# Patient Record
Sex: Female | Born: 1995 | Race: White | Hispanic: No | Marital: Single | Smoking: Never smoker
Health system: Southern US, Community
[De-identification: ages and names within clinical notes are randomized; demographics above are authoritative.]

## PROBLEM LIST (undated history)

## (undated) DIAGNOSIS — F329 Major depressive disorder, single episode, unspecified: Secondary | ICD-10-CM

## (undated) DIAGNOSIS — R112 Nausea with vomiting, unspecified: Secondary | ICD-10-CM

## (undated) DIAGNOSIS — N83209 Unspecified ovarian cyst, unspecified side: Secondary | ICD-10-CM

## (undated) DIAGNOSIS — T4145XA Adverse effect of unspecified anesthetic, initial encounter: Secondary | ICD-10-CM

## (undated) DIAGNOSIS — F419 Anxiety disorder, unspecified: Secondary | ICD-10-CM

## (undated) DIAGNOSIS — F32A Depression, unspecified: Secondary | ICD-10-CM

## (undated) DIAGNOSIS — Z9889 Other specified postprocedural states: Secondary | ICD-10-CM

## (undated) DIAGNOSIS — R519 Headache, unspecified: Secondary | ICD-10-CM

## (undated) DIAGNOSIS — R87629 Unspecified abnormal cytological findings in specimens from vagina: Secondary | ICD-10-CM

## (undated) DIAGNOSIS — S82899A Other fracture of unspecified lower leg, initial encounter for closed fracture: Secondary | ICD-10-CM

## (undated) DIAGNOSIS — F909 Attention-deficit hyperactivity disorder, unspecified type: Secondary | ICD-10-CM

## (undated) DIAGNOSIS — E039 Hypothyroidism, unspecified: Secondary | ICD-10-CM

## (undated) DIAGNOSIS — T8859XA Other complications of anesthesia, initial encounter: Secondary | ICD-10-CM

## (undated) DIAGNOSIS — E079 Disorder of thyroid, unspecified: Secondary | ICD-10-CM

## (undated) DIAGNOSIS — G932 Benign intracranial hypertension: Secondary | ICD-10-CM

## (undated) DIAGNOSIS — N390 Urinary tract infection, site not specified: Secondary | ICD-10-CM

## (undated) HISTORY — DX: Anxiety disorder, unspecified: F41.9

## (undated) HISTORY — PX: DILATION AND CURETTAGE OF UTERUS: SHX78

## (undated) HISTORY — PX: TYMPANOSTOMY TUBE PLACEMENT: SHX32

## (undated) HISTORY — PX: CHOLECYSTECTOMY: SHX55

## (undated) HISTORY — DX: Depression, unspecified: F32.A

## (undated) HISTORY — DX: Attention-deficit hyperactivity disorder, unspecified type: F90.9

## (undated) HISTORY — PX: ADENOIDECTOMY: SUR15

## (undated) HISTORY — DX: Adverse effect of unspecified anesthetic, initial encounter: T41.45XA

## (undated) HISTORY — DX: Disorder of thyroid, unspecified: E07.9

---
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
## 2014-04-14 HISTORY — PX: LAPAROSCOPIC CHOLECYSTECTOMY: SUR755

## 2015-06-26 ENCOUNTER — Other Ambulatory Visit: Payer: Self-pay | Admitting: Obstetrics and Gynecology

## 2015-06-26 NOTE — H&P (Signed)
20 y.o. yo G1 with a 6 week size MAB at 9 weeks by dates.  She has had two [place] that show a fetal pole without FHTs.  She has had only scant bleeding.  No past medical history on file.No past surgical history on file.  Social History   Social History  . Marital Status: Married    Spouse Name: N/A  . Number of Children: N/A  . Years of Education: N/A   Occupational History  . Not on file.   Social History Main Topics  . Smoking status: Not on file  . Smokeless tobacco: Not on file  . Alcohol Use: Not on file  . Drug Use: Not on file  . Sexual Activity: Not on file   Other Topics Concern  . Not on file   Social History Narrative  . No narrative on file    No current facility-administered medications on file prior to encounter.   No current outpatient prescriptions on file prior to encounter.    Allergies  Allergen Reactions  . Xanax [Alprazolam] Anxiety    Has opposite effect    @VITALS2 @  Lungs: clear to ascultation Cor:  RRR Abdomen:  soft, nontender, nondistended. Ex:  no cords, erythema Pelvic:  Deferred to OR.  A:  MAB at 6 weeks, desires definitive therapy.  Pt's blod type is A+.   P: All risks, benefits and alternatives d/w patient and she desires to proceed.  Alaynah Schutter A

## 2015-06-27 ENCOUNTER — Ambulatory Visit (HOSPITAL_COMMUNITY): Payer: BLUE CROSS/BLUE SHIELD | Admitting: Anesthesiology

## 2015-06-27 ENCOUNTER — Encounter (HOSPITAL_COMMUNITY)
Admission: RE | Disposition: A | Discharge status: Home / Self Care | Payer: Self-pay | Source: Ambulatory Visit | Attending: Obstetrics and Gynecology

## 2015-06-27 ENCOUNTER — Encounter (HOSPITAL_COMMUNITY): Payer: Self-pay | Admitting: Anesthesiology

## 2015-06-27 ENCOUNTER — Ambulatory Visit (HOSPITAL_COMMUNITY)
Admission: RE | Admit: 2015-06-27 | Discharge: 2015-06-27 | Disposition: A | Discharge status: Home / Self Care | Payer: BLUE CROSS/BLUE SHIELD | Source: Ambulatory Visit | Attending: Obstetrics and Gynecology | Admitting: Obstetrics and Gynecology

## 2015-06-27 DIAGNOSIS — Z3A09 9 weeks gestation of pregnancy: Secondary | ICD-10-CM | POA: Insufficient documentation

## 2015-06-27 DIAGNOSIS — O021 Missed abortion: Secondary | ICD-10-CM | POA: Diagnosis not present

## 2015-06-27 HISTORY — DX: Benign intracranial hypertension: G93.2

## 2015-06-27 HISTORY — PX: DILATION AND EVACUATION: SHX1459

## 2015-06-27 LAB — CBC
HEMATOCRIT: 37.3 % (ref 36.0–46.0)
HEMOGLOBIN: 12.5 g/dL (ref 12.0–15.0)
MCH: 28.5 pg (ref 26.0–34.0)
MCHC: 33.5 g/dL (ref 30.0–36.0)
MCV: 85.2 fL (ref 78.0–100.0)
Platelets: 283 10*3/uL (ref 150–400)
RBC: 4.38 MIL/uL (ref 3.87–5.11)
RDW: 13.1 % (ref 11.5–15.5)
WBC: 8.5 10*3/uL (ref 4.0–10.5)

## 2015-06-27 SURGERY — DILATION AND EVACUATION, UTERUS
Anesthesia: General | Site: Vagina

## 2015-06-27 MED ORDER — PROPOFOL 10 MG/ML IV BOLUS
INTRAVENOUS | Status: AC
  Filled 2015-06-27: qty 20

## 2015-06-27 MED ORDER — ONDANSETRON HCL 4 MG/2ML IJ SOLN
INTRAMUSCULAR | Status: DC | PRN
  Administered 2015-06-27: 4 mg via INTRAVENOUS

## 2015-06-27 MED ORDER — LIDOCAINE HCL (CARDIAC) 20 MG/ML IV SOLN
INTRAVENOUS | Status: AC
  Filled 2015-06-27: qty 5

## 2015-06-27 MED ORDER — KETOROLAC TROMETHAMINE 30 MG/ML IJ SOLN
INTRAMUSCULAR | Status: DC | PRN
  Administered 2015-06-27: 30 mg via INTRAVENOUS

## 2015-06-27 MED ORDER — ONDANSETRON HCL 4 MG/2ML IJ SOLN
INTRAMUSCULAR | Status: AC
  Filled 2015-06-27: qty 2

## 2015-06-27 MED ORDER — KETOROLAC TROMETHAMINE 30 MG/ML IJ SOLN
INTRAMUSCULAR | Status: AC
  Filled 2015-06-27: qty 1

## 2015-06-27 MED ORDER — DEXAMETHASONE SODIUM PHOSPHATE 10 MG/ML IJ SOLN
INTRAMUSCULAR | Status: AC
  Filled 2015-06-27: qty 1

## 2015-06-27 MED ORDER — SCOPOLAMINE 1 MG/3DAYS TD PT72
1.0000 | MEDICATED_PATCH | Freq: Once | TRANSDERMAL | Status: DC
  Administered 2015-06-27: 1.5 mg via TRANSDERMAL

## 2015-06-27 MED ORDER — FENTANYL CITRATE (PF) 100 MCG/2ML IJ SOLN
INTRAMUSCULAR | Status: DC | PRN
  Administered 2015-06-27: 100 ug via INTRAVENOUS
  Administered 2015-06-27: 50 ug via INTRAVENOUS
  Administered 2015-06-27 (×2): 25 ug via INTRAVENOUS

## 2015-06-27 MED ORDER — METHYLPREDNISOLONE SODIUM SUCC 125 MG IJ SOLR
INTRAMUSCULAR | Status: AC
Start: 2015-06-27 — End: 2015-06-27
  Filled 2015-06-27: qty 2

## 2015-06-27 MED ORDER — MEPERIDINE HCL 25 MG/ML IJ SOLN
6.2500 mg | INTRAMUSCULAR | Status: DC | PRN
  Administered 2015-06-27: 12.5 mg via INTRAVENOUS

## 2015-06-27 MED ORDER — FENTANYL CITRATE (PF) 100 MCG/2ML IJ SOLN
INTRAMUSCULAR | Status: AC
  Filled 2015-06-27: qty 2

## 2015-06-27 MED ORDER — METHYLERGONOVINE MALEATE 0.2 MG/ML IJ SOLN
INTRAMUSCULAR | Status: DC | PRN
  Administered 2015-06-27: 0.2 mg via INTRAMUSCULAR

## 2015-06-27 MED ORDER — LIDOCAINE HCL (CARDIAC) 20 MG/ML IV SOLN
INTRAVENOUS | Status: DC | PRN
  Administered 2015-06-27: 30 mg via INTRAVENOUS

## 2015-06-27 MED ORDER — LIDOCAINE HCL (CARDIAC) 20 MG/ML IV SOLN
INTRAVENOUS | Status: AC
Start: 2015-06-27 — End: 2015-06-27
  Filled 2015-06-27: qty 5

## 2015-06-27 MED ORDER — SCOPOLAMINE 1 MG/3DAYS TD PT72
MEDICATED_PATCH | TRANSDERMAL | Status: AC
  Administered 2015-06-27: 1.5 mg via TRANSDERMAL
  Filled 2015-06-27: qty 1

## 2015-06-27 MED ORDER — DEXAMETHASONE SODIUM PHOSPHATE 4 MG/ML IJ SOLN
INTRAMUSCULAR | Status: AC
  Filled 2015-06-27: qty 1

## 2015-06-27 MED ORDER — LACTATED RINGERS IV SOLN
INTRAVENOUS | Status: DC
  Administered 2015-06-27 (×2): via INTRAVENOUS

## 2015-06-27 MED ORDER — PROPOFOL 10 MG/ML IV BOLUS
INTRAVENOUS | Status: DC | PRN
  Administered 2015-06-27: 200 mg via INTRAVENOUS
  Administered 2015-06-27: 50 mg via INTRAVENOUS
  Administered 2015-06-27: 150 mg via INTRAVENOUS

## 2015-06-27 MED ORDER — DEXAMETHASONE SODIUM PHOSPHATE 4 MG/ML IJ SOLN
INTRAMUSCULAR | Status: DC | PRN
  Administered 2015-06-27: 4 mg via INTRAVENOUS

## 2015-06-27 MED ORDER — MEPERIDINE HCL 25 MG/ML IJ SOLN
INTRAMUSCULAR | Status: AC
  Filled 2015-06-27: qty 1

## 2015-06-27 SURGICAL SUPPLY — 18 items
CATH ROBINSON RED A/P 16FR (CATHETERS) ×2 IMPLANT
CLOTH BEACON ORANGE TIMEOUT ST (SAFETY) ×2 IMPLANT
DECANTER SPIKE VIAL GLASS SM (MISCELLANEOUS) ×2 IMPLANT
GLOVE BIO SURGEON STRL SZ7 (GLOVE) ×2 IMPLANT
GLOVE BIOGEL PI IND STRL 7.0 (GLOVE) ×2 IMPLANT
GLOVE BIOGEL PI INDICATOR 7.0 (GLOVE) ×2
GOWN STRL REUS W/TWL LRG LVL3 (GOWN DISPOSABLE) ×4 IMPLANT
KIT BERKELEY 1ST TRIMESTER 3/8 (MISCELLANEOUS) ×2 IMPLANT
NS IRRIG 1000ML POUR BTL (IV SOLUTION) ×2 IMPLANT
PACK VAGINAL MINOR WOMEN LF (CUSTOM PROCEDURE TRAY) ×2 IMPLANT
PAD OB MATERNITY 4.3X12.25 (PERSONAL CARE ITEMS) ×2 IMPLANT
PAD PREP 24X48 CUFFED NSTRL (MISCELLANEOUS) ×2 IMPLANT
SET BERKELEY SUCTION TUBING (SUCTIONS) ×2 IMPLANT
TOWEL OR 17X24 6PK STRL BLUE (TOWEL DISPOSABLE) ×4 IMPLANT
VACURETTE 10 RIGID CVD (CANNULA) IMPLANT
VACURETTE 7MM CVD STRL WRAP (CANNULA) IMPLANT
VACURETTE 8 RIGID CVD (CANNULA) ×2 IMPLANT
VACURETTE 9 RIGID CVD (CANNULA) IMPLANT

## 2015-06-27 NOTE — Anesthesia Procedure Notes (Signed)
Procedure Name: LMA Insertion Date/Time: 06/27/2015 12:21 PM Performed by: Dustin Burrill G Pre-anesthesia Checklist: Patient identified, Patient being monitored, Emergency Drugs available, Timeout performed and Suction available Patient Re-evaluated:Patient Re-evaluated prior to inductionOxygen Delivery Method: Circle system utilized Preoxygenation: Pre-oxygenation with 100% oxygen Intubation Type: IV induction Ventilation: Mask ventilation without difficulty LMA: LMA inserted LMA Size: 4.0 Number of attempts: 1 Dental Injury: Teeth and Oropharynx as per pre-operative assessment  Comments: Pt moves with LMA insertion additional propofol injected.

## 2015-06-27 NOTE — Brief Op Note (Signed)
06/27/2015  12:36 PM  PATIENT:  Renee Jensen  20 y.o. female  PRE-OPERATIVE DIAGNOSIS:  MAB  POST-OPERATIVE DIAGNOSIS:  Missed Abortion  PROCEDURE:  Procedure(s): DILATATION AND EVACUATION (N/A)  SURGEON:  Surgeon(s) and Role:    * Renee Charleston, MD - Primary   ANESTHESIA:   general  EBL:   min  SPECIMEN:  Source of Specimen:  uterine contents  DISPOSITION OF SPECIMEN:  PATHOLOGY  COUNTS:  YES  TOURNIQUET:  * No tourniquets in log *  DICTATION: .Note written in EPIC  PLAN OF CARE: Discharge to home after PACU  PATIENT DISPOSITION:  PACU - hemodynamically stable.   Delay start of Pharmacological VTE agent (>24hrs) due to surgical blood loss or risk of bleeding: not applicable  Medications: Methergine  Complications: None  Findings:  7 week size uterus to 6 size post procedure.  Good crie was achieved.  After adequate anesthesia was achieved, the patient was prepped and draped in the usual sterile fashion.  The speculum was placed in the vagina and the cervix stabilized with a single-tooth tenaculum.  The cervix was dilated with Renee Jensen dilators and the 9 mm curette was used to remove contents of the uterus.  Alternating sharp curettage with a curette and suction curettage was performed until all contents were removed and good crie was achieved.  All instruments were removed from the vagina.  The patient tolerated the procedure well.    Renee Jensen A

## 2015-06-27 NOTE — Progress Notes (Signed)
There has been no change in the patients history, status or exam since the history and physical.  Filed Vitals:   06/27/15 1029  BP: 124/69  Pulse: 102  Temp: 97.7 F (36.5 C)  TempSrc: Oral  Resp: 18  Height: 5\' 7"  (1.702 m)  Weight: 120.203 kg (265 lb)  SpO2: 100%    Lab Results  Component Value Date   WBC 8.5 06/27/2015   HGB 12.5 06/27/2015   HCT 37.3 06/27/2015   MCV 85.2 06/27/2015   PLT 283 06/27/2015    Nial Hawe A

## 2015-06-27 NOTE — Anesthesia Preprocedure Evaluation (Addendum)
Anesthesia Evaluation  Patient identified by MRN, date of birth, ID band Patient awake    Reviewed: Allergy & Precautions, NPO status , Patient's Chart, lab work & pertinent test results  Airway Mallampati: II  TM Distance: >3 FB Neck ROM: Full    Dental no notable dental hx. (+) Teeth Intact   Pulmonary neg pulmonary ROS,    Pulmonary exam normal breath sounds clear to auscultation       Cardiovascular negative cardio ROS Normal cardiovascular exam Rhythm:Regular Rate:Normal     Neuro/Psych negative neurological ROS  negative psych ROS   GI/Hepatic negative GI ROS, Neg liver ROS,   Endo/Other  Hypothyroidism   Renal/GU negative Renal ROS  negative genitourinary   Musculoskeletal negative musculoskeletal ROS (+)   Abdominal   Peds  Hematology negative hematology ROS (+)   Anesthesia Other Findings   Reproductive/Obstetrics Missed Ab                             Anesthesia Physical Anesthesia Plan  ASA: II  Anesthesia Plan: General   Post-op Pain Management:    Induction: Intravenous  Airway Management Planned: LMA  Additional Equipment:   Intra-op Plan:   Post-operative Plan: Extubation in OR  Informed Consent: I have reviewed the patients History and Physical, chart, labs and discussed the procedure including the risks, benefits and alternatives for the proposed anesthesia with the patient or authorized representative who has indicated his/her understanding and acceptance.   Dental advisory given  Plan Discussed with: Anesthesiologist, CRNA and Surgeon  Anesthesia Plan Comments: (NO VERSED. )       Anesthesia Quick Evaluation

## 2015-06-27 NOTE — Transfer of Care (Signed)
Immediate Anesthesia Transfer of Care Note  Patient: Renee Jensen  Procedure(s) Performed: Procedure(s): DILATATION AND EVACUATION (N/A)  Patient Location: PACU  Anesthesia Type:General  Level of Consciousness: awake and alert   Airway & Oxygen Therapy: Patient Spontanous Breathing  Post-op Assessment: Report given to RN and Post -op Vital signs reviewed and stable  Post vital signs: Reviewed and stable  Last Vitals:  Filed Vitals:   06/27/15 1029  BP: 124/69  Pulse: 102  Temp: 36.5 C  Resp: 18    Complications: No apparent anesthesia complications

## 2015-06-27 NOTE — Op Note (Signed)
06/27/2015  12:36 PM  PATIENT:  Renee Jensen  20 y.o. female  PRE-OPERATIVE DIAGNOSIS:  MAB  POST-OPERATIVE DIAGNOSIS:  Missed Abortion  PROCEDURE:  Procedure(s): DILATATION AND EVACUATION (N/A)  SURGEON:  Surgeon(s) and Role:    * Bobbye Charleston, MD - Primary   ANESTHESIA:   general  EBL:   min  SPECIMEN:  Source of Specimen:  uterine contents  DISPOSITION OF SPECIMEN:  PATHOLOGY  COUNTS:  YES  TOURNIQUET:  * No tourniquets in log *  DICTATION: .Note written in EPIC  PLAN OF CARE: Discharge to home after PACU  PATIENT DISPOSITION:  PACU - hemodynamically stable.   Delay start of Pharmacological VTE agent (>24hrs) due to surgical blood loss or risk of bleeding: not applicable  Medications: Methergine  Complications: None  Findings:  7 week size uterus to 6 size post procedure.  Good crie was achieved.  After adequate anesthesia was achieved, the patient was prepped and draped in the usual sterile fashion.  The speculum was placed in the vagina and the cervix stabilized with a single-tooth tenaculum.  The cervix was dilated with Kennon Rounds dilators and the 9 mm curette was used to remove contents of the uterus.  Alternating sharp curettage with a curette and suction curettage was performed until all contents were removed and good crie was achieved.  All instruments were removed from the vagina.  The patient tolerated the procedure well.    [name]

## 2015-06-30 ENCOUNTER — Encounter (HOSPITAL_COMMUNITY): Payer: Self-pay | Admitting: Obstetrics and Gynecology

## 2015-07-03 NOTE — Anesthesia Postprocedure Evaluation (Signed)
Anesthesia Post Note  Patient: Renee Jensen  Procedure(s) Performed: Procedure(s) (LRB): DILATATION AND EVACUATION (N/A)  Patient location during evaluation: PACU Anesthesia Type: MAC Level of consciousness: awake and alert and oriented Pain management: pain level controlled Respiratory status: spontaneous breathing, nonlabored ventilation and respiratory function stable Cardiovascular status: blood pressure returned to baseline and stable Postop Assessment: no signs of nausea or vomiting Anesthetic complications: no    Last Vitals:  Filed Vitals:   06/27/15 1326 06/27/15 1335  BP: 131/74 131/72  Pulse: 81 81  Temp:  37.1 C  Resp: 17 18    Last Pain:  Filed Vitals:   06/27/15 1344  PainSc: 0-No pain                 [name]

## 2015-10-25 ENCOUNTER — Inpatient Hospital Stay (HOSPITAL_COMMUNITY): Payer: BLUE CROSS/BLUE SHIELD | Admitting: Anesthesiology

## 2015-10-25 ENCOUNTER — Encounter (HOSPITAL_COMMUNITY): Payer: Self-pay | Admitting: Anesthesiology

## 2015-10-25 ENCOUNTER — Inpatient Hospital Stay (HOSPITAL_COMMUNITY): Payer: BLUE CROSS/BLUE SHIELD

## 2015-10-25 ENCOUNTER — Observation Stay (HOSPITAL_COMMUNITY)
Admission: AD | Admit: 2015-10-25 | Discharge: 2015-10-25 | Disposition: A | Discharge status: Home / Self Care | Payer: BLUE CROSS/BLUE SHIELD | Source: Ambulatory Visit | Attending: Obstetrics and Gynecology | Admitting: Obstetrics and Gynecology

## 2015-10-25 ENCOUNTER — Encounter (HOSPITAL_COMMUNITY): Payer: Self-pay | Admitting: Certified Nurse Midwife

## 2015-10-25 ENCOUNTER — Encounter (HOSPITAL_COMMUNITY)
Admission: AD | Disposition: A | Discharge status: Home / Self Care | Payer: Self-pay | Source: Ambulatory Visit | Attending: Obstetrics and Gynecology

## 2015-10-25 DIAGNOSIS — O3481 Maternal care for other abnormalities of pelvic organs, first trimester: Secondary | ICD-10-CM | POA: Insufficient documentation

## 2015-10-25 DIAGNOSIS — N8311 Corpus luteum cyst of right ovary: Secondary | ICD-10-CM | POA: Insufficient documentation

## 2015-10-25 DIAGNOSIS — O021 Missed abortion: Principal | ICD-10-CM | POA: Diagnosis present

## 2015-10-25 DIAGNOSIS — O209 Hemorrhage in early pregnancy, unspecified: Secondary | ICD-10-CM

## 2015-10-25 DIAGNOSIS — Z888 Allergy status to other drugs, medicaments and biological substances status: Secondary | ICD-10-CM | POA: Insufficient documentation

## 2015-10-25 DIAGNOSIS — O4690 Antepartum hemorrhage, unspecified, unspecified trimester: Secondary | ICD-10-CM

## 2015-10-25 DIAGNOSIS — Z3A01 Less than 8 weeks gestation of pregnancy: Secondary | ICD-10-CM | POA: Insufficient documentation

## 2015-10-25 HISTORY — PX: DILATION AND EVACUATION: SHX1459

## 2015-10-25 LAB — URINALYSIS, ROUTINE W REFLEX MICROSCOPIC
Bilirubin Urine: NEGATIVE
GLUCOSE, UA: NEGATIVE mg/dL
KETONES UR: NEGATIVE mg/dL
Leukocytes, UA: NEGATIVE
Nitrite: NEGATIVE
PROTEIN: NEGATIVE mg/dL
Specific Gravity, Urine: 1.025 (ref 1.005–1.030)
pH: 6.5 (ref 5.0–8.0)

## 2015-10-25 LAB — CBC
HEMATOCRIT: 36.5 % (ref 36.0–46.0)
Hemoglobin: 12.6 g/dL (ref 12.0–15.0)
MCH: 29.4 pg (ref 26.0–34.0)
MCHC: 34.5 g/dL (ref 30.0–36.0)
MCV: 85.3 fL (ref 78.0–100.0)
PLATELETS: 259 10*3/uL (ref 150–400)
RBC: 4.28 MIL/uL (ref 3.87–5.11)
RDW: 13.4 % (ref 11.5–15.5)
WBC: 6.4 10*3/uL (ref 4.0–10.5)

## 2015-10-25 LAB — TYPE AND SCREEN
ABO/RH(D): A POS
ANTIBODY SCREEN: NEGATIVE

## 2015-10-25 LAB — ABO/RH: ABO/RH(D): A POS

## 2015-10-25 LAB — HCG, QUANTITATIVE, PREGNANCY: hCG, Beta Chain, Quant, S: 27671 m[IU]/mL — ABNORMAL HIGH (ref <5)

## 2015-10-25 LAB — URINE MICROSCOPIC-ADD ON: WBC, UA: NONE SEEN WBC/hpf (ref 0–5)

## 2015-10-25 SURGERY — DILATION AND EVACUATION, UTERUS
Anesthesia: General | Site: Vagina

## 2015-10-25 MED ORDER — CITRIC ACID-SODIUM CITRATE 334-500 MG/5ML PO SOLN
30.0000 mL | Freq: Once | ORAL | Status: AC
  Administered 2015-10-25: 30 mL via ORAL
  Filled 2015-10-25: qty 15

## 2015-10-25 MED ORDER — METHYLERGONOVINE MALEATE 0.2 MG/ML IJ SOLN
INTRAMUSCULAR | Status: DC | PRN
  Administered 2015-10-25: 0.2 mg via INTRAMUSCULAR

## 2015-10-25 MED ORDER — MEPERIDINE HCL 25 MG/ML IJ SOLN: 6.2500 mg | INTRAMUSCULAR | Status: DC | PRN

## 2015-10-25 MED ORDER — LIDOCAINE HCL (CARDIAC) 20 MG/ML IV SOLN
INTRAVENOUS | Status: AC
Start: 2015-10-25 — End: 2015-10-25
  Filled 2015-10-25: qty 5

## 2015-10-25 MED ORDER — ONDANSETRON HCL 4 MG/2ML IJ SOLN
INTRAMUSCULAR | Status: DC | PRN
  Administered 2015-10-25: 4 mg via INTRAVENOUS

## 2015-10-25 MED ORDER — PROPOFOL 10 MG/ML IV BOLUS
INTRAVENOUS | Status: AC
  Filled 2015-10-25: qty 20

## 2015-10-25 MED ORDER — FENTANYL CITRATE (PF) 100 MCG/2ML IJ SOLN
INTRAMUSCULAR | Status: AC
Start: 2015-10-25 — End: 2015-10-25
  Filled 2015-10-25: qty 2

## 2015-10-25 MED ORDER — METHYLERGONOVINE MALEATE 0.2 MG/ML IJ SOLN
INTRAMUSCULAR | Status: AC
  Filled 2015-10-25: qty 1

## 2015-10-25 MED ORDER — LACTATED RINGERS IV SOLN: INTRAVENOUS | Status: DC

## 2015-10-25 MED ORDER — FAMOTIDINE IN NACL 20-0.9 MG/50ML-% IV SOLN
20.0000 mg | Freq: Once | INTRAVENOUS | Status: AC
  Administered 2015-10-25: 20 mg via INTRAVENOUS
  Filled 2015-10-25: qty 50

## 2015-10-25 MED ORDER — PROPOFOL 10 MG/ML IV BOLUS
INTRAVENOUS | Status: DC | PRN
Start: 2015-10-25 — End: 2015-10-25
  Administered 2015-10-25: 300 mg via INTRAVENOUS

## 2015-10-25 MED ORDER — LACTATED RINGERS IV SOLN
INTRAVENOUS | Status: DC
Start: 2015-10-25 — End: 2015-10-25
  Administered 2015-10-25: 125 mL/h via INTRAVENOUS
  Administered 2015-10-25: 13:00:00 via INTRAVENOUS

## 2015-10-25 MED ORDER — FENTANYL CITRATE (PF) 100 MCG/2ML IJ SOLN
INTRAMUSCULAR | Status: DC | PRN
  Administered 2015-10-25 (×2): 50 ug via INTRAVENOUS

## 2015-10-25 MED ORDER — KETOROLAC TROMETHAMINE 30 MG/ML IJ SOLN
INTRAMUSCULAR | Status: DC | PRN
  Administered 2015-10-25: 30 mg via INTRAVENOUS

## 2015-10-25 MED ORDER — ONDANSETRON HCL 4 MG/2ML IJ SOLN
INTRAMUSCULAR | Status: AC
  Filled 2015-10-25: qty 2

## 2015-10-25 MED ORDER — FENTANYL CITRATE (PF) 100 MCG/2ML IJ SOLN: 25.0000 ug | INTRAMUSCULAR | Status: DC | PRN

## 2015-10-25 MED ORDER — METOCLOPRAMIDE HCL 5 MG/ML IJ SOLN: 10.0000 mg | Freq: Once | INTRAMUSCULAR | Status: DC | PRN

## 2015-10-25 MED ORDER — LIDOCAINE HCL (CARDIAC) 20 MG/ML IV SOLN
INTRAVENOUS | Status: DC | PRN
  Administered 2015-10-25: 60 mg via INTRAVENOUS

## 2015-10-25 SURGICAL SUPPLY — 18 items
CATH ROBINSON RED A/P 16FR (CATHETERS) ×2 IMPLANT
CLOTH BEACON ORANGE TIMEOUT ST (SAFETY) ×2 IMPLANT
DECANTER SPIKE VIAL GLASS SM (MISCELLANEOUS) ×2 IMPLANT
GLOVE BIO SURGEON STRL SZ7 (GLOVE) ×2 IMPLANT
GLOVE BIOGEL PI IND STRL 7.0 (GLOVE) ×2 IMPLANT
GLOVE BIOGEL PI INDICATOR 7.0 (GLOVE) ×2
GOWN STRL REUS W/TWL LRG LVL3 (GOWN DISPOSABLE) ×4 IMPLANT
KIT BERKELEY 1ST TRIMESTER 3/8 (MISCELLANEOUS) ×2 IMPLANT
NS IRRIG 1000ML POUR BTL (IV SOLUTION) ×2 IMPLANT
PACK VAGINAL MINOR WOMEN LF (CUSTOM PROCEDURE TRAY) ×2 IMPLANT
PAD OB MATERNITY 4.3X12.25 (PERSONAL CARE ITEMS) ×2 IMPLANT
PAD PREP 24X48 CUFFED NSTRL (MISCELLANEOUS) ×2 IMPLANT
SET BERKELEY SUCTION TUBING (SUCTIONS) ×2 IMPLANT
TOWEL OR 17X24 6PK STRL BLUE (TOWEL DISPOSABLE) ×4 IMPLANT
VACURETTE 10 RIGID CVD (CANNULA) IMPLANT
VACURETTE 7MM CVD STRL WRAP (CANNULA) IMPLANT
VACURETTE 8 RIGID CVD (CANNULA) IMPLANT
VACURETTE 9 RIGID CVD (CANNULA) ×2 IMPLANT

## 2015-10-25 NOTE — Transfer of Care (Signed)
Immediate Anesthesia Transfer of Care Note  Patient: Renee Jensen  Procedure(s) Performed: Procedure(s): DILATATION AND EVACUATION (N/A)  Patient Location: PACU  Anesthesia Type:General  Level of Consciousness: awake  Airway & Oxygen Therapy: Patient Spontanous Breathing  Post-op Assessment: Report given to RN and Post -op Vital signs reviewed and stable  Post vital signs: stable  Last Vitals:  Filed Vitals:   10/25/15 0952 10/25/15 1357  BP: 122/68 117/80  Pulse: 100 103  Temp: 36.4 C 36.9 C  Resp: 20 16    Last Pain:  Filed Vitals:   10/25/15 1359  PainSc: 8          Complications: No apparent anesthesia complications

## 2015-10-25 NOTE — Brief Op Note (Signed)
10/25/2015  1:51 PM  PATIENT:  Renee Jensen  20 y.o. female  PRE-OPERATIVE DIAGNOSIS:  missed abortion  POST-OPERATIVE DIAGNOSIS:  missed abortion  PROCEDURE:  Procedure(s): DILATATION AND EVACUATION (N/A)  SURGEON:  Surgeon(s) and Role:    * Bobbye Charleston, MD - Primary   ANESTHESIA:   general  EBL:  Total I/O In: -  Out: 90 [Urine:40; Blood:50]  SPECIMEN:  Source of Specimen:  uterine curettings  DISPOSITION OF SPECIMEN:  PATHOLOGY  COUNTS:  YES  TOURNIQUET:  * No tourniquets in log *  DICTATION: .Note written in EPIC  PLAN OF CARE: Discharge to home after PACU  PATIENT DISPOSITION:  PACU - hemodynamically stable.   Delay start of Pharmacological VTE agent (>24hrs) due to surgical blood loss or risk of bleeding: not applicable  Medications: Methergine  Complications: None  Findings:  8 week size uterus to 7 size post procedure.  Good crie was achieved.  After adequate anesthesia was achieved, the patient was prepped and draped in the usual sterile fashion.  The speculum was placed in the vagina and the cervix stabilized with a single-tooth tenaculum.  The cervix was dilated with Kennon Rounds dilators and the 9 mm curette was used to remove contents of the uterus.  Alternating sharp curettage with a curette and suction curettage was performed until all contents were removed and good crie was achieved.  All instruments were removed from the vagina.  The patient tolerated the procedure well.    [name]

## 2015-10-25 NOTE — MAU Provider Note (Signed)
MAU HISTORY AND PHYSICAL  Chief Complaint:  Abdominal Pain and Vaginal Bleeding   Renee Jensen is a 20 y.o.  G2P0010 with IUP at [redacted]w[redacted]d presenting for Abdominal Pain and Vaginal Bleeding  Says has had confirmed IUP with heart rate in OB office.  Began with vaginal bleeding for 3 days. Initially very light, now like a light period with small clots. Began experiencing sharp cramping last night which continues. No fevers or dysuria. Mild nausea present before other symptoms began.     Past Medical History  Diagnosis Date  . Pseudotumor cerebri     Past Surgical History  Procedure Laterality Date  . Cholecystectomy    . Adenoidectomy    . Tympanostomy tube placement    . Dilation and evacuation N/A 06/27/2015    Procedure: DILATATION AND EVACUATION;  Surgeon: Bobbye Charleston, MD;  Location: [facility];  Service: Gynecology;  Laterality: N/A;    Family History  Problem Relation Age of Onset  . Ulcerative colitis Mother   . Rheum arthritis Mother   . Fibromyalgia Mother   . Hypothyroidism Mother   . Kidney disease Mother   . Diabetes Father   . Hypertension Father   . Heart disease Father   . Hyperlipidemia Father     Social History  Substance Use Topics  . Smoking status: Never Smoker   . Smokeless tobacco: None  . Alcohol Use: No    Allergies  Allergen Reactions  . Xanax [Alprazolam] Anxiety    Has opposite effect    Prescriptions prior to admission  Medication Sig Dispense Refill Last Dose  . acetaminophen (TYLENOL) 160 MG/5ML liquid Take 500 mg by mouth every 4 (four) hours as needed for pain.   Past Month at Unknown time  . levothyroxine (SYNTHROID, LEVOTHROID) 112 MCG tablet Take 112 mcg by mouth daily before breakfast.   10/24/2015 at Unknown time  . Prenatal Vit-Fe Fumarate-FA (MULTIVITAMIN-PRENATAL) 27-0.8 MG TABS tablet Take 1 tablet by mouth daily at 12 noon.   Past Week at Unknown time    Review of Systems - Negative except for what is mentioned in  HPI.  Physical Exam  Blood pressure 122/68, pulse 100, temperature 97.5 F (36.4 C), temperature source Oral, resp. rate 20, height 5\' 6"  (1.676 m), weight 266 lb 6.4 oz (120.838 kg), last menstrual period 07/28/2015. GENERAL: Well-developed, well-nourished female in no acute distress.  LUNGS: Clear to auscultation bilaterally.  HEART: Regular rate and rhythm. ABDOMEN: Soft, nontender, nondistended, gravid.  EXTREMITIES: Nontender, no edema, 2+ distal pulses. GU: moderate amount blood in vagina, dark. Cervix visually closed, no products identified  Labs: Results for orders placed or performed during the hospital encounter of 10/25/15 (from the past 24 hour(s))  Urinalysis, Routine w reflex microscopic (not at [facility])   Collection Time: 10/25/15  9:43 AM  Result Value Ref Range   Color, Urine YELLOW YELLOW   APPearance CLEAR CLEAR   Specific Gravity, Urine 1.025 1.005 - 1.030   pH 6.5 5.0 - 8.0   Glucose, UA NEGATIVE NEGATIVE mg/dL   Hgb urine dipstick LARGE (A) NEGATIVE   Bilirubin Urine NEGATIVE NEGATIVE   Ketones, ur NEGATIVE NEGATIVE mg/dL   Protein, ur NEGATIVE NEGATIVE mg/dL   Nitrite NEGATIVE NEGATIVE   Leukocytes, UA NEGATIVE NEGATIVE  Urine microscopic-add on   Collection Time: 10/25/15  9:43 AM  Result Value Ref Range   Squamous Epithelial / LPF 6-30 (A) NONE SEEN   WBC, UA NONE SEEN 0 - 5 WBC/hpf   RBC /  HPF 6-30 0 - 5 RBC/hpf   Bacteria, UA MANY (A) NONE SEEN  CBC   Collection Time: 10/25/15  9:57 AM  Result Value Ref Range   WBC 6.4 4.0 - 10.5 K/uL   RBC 4.28 3.87 - 5.11 MIL/uL   Hemoglobin 12.6 12.0 - 15.0 g/dL   HCT 36.5 36.0 - 46.0 %   MCV 85.3 78.0 - 100.0 fL   MCH 29.4 26.0 - 34.0 pg   MCHC 34.5 30.0 - 36.0 g/dL   RDW 13.4 11.5 - 15.5 %   Platelets 259 150 - 400 K/uL  hCG, quantitative, pregnancy   Collection Time: 10/25/15  9:57 AM  Result Value Ref Range   hCG, Beta Chain, Quant, S 27671 (H) <5 mIU/mL  Type and screen   Collection Time: 10/25/15   9:57 AM  Result Value Ref Range   ABO/RH(D) A POS    Antibody Screen NEG    Sample Expiration 10/28/2015     Imaging Studies:  US Ob Comp Less 14 Wks  10/25/2015  CLINICAL DATA:  Vaginal bleeding. History states line IUP 7 weeks 2 days noted on ultrasound last week at physician's office. Unsure of LMP. History of recent spontaneous abortion. EXAM: OBSTETRIC <14 WK ULTRASOUND TECHNIQUE: Transabdominal ultrasound was performed for evaluation of the gestation as well as the maternal uterus and adnexal regions. COMPARISON:  None. FINDINGS: Intrauterine gestational sac: Single. Yolk sac:  Visualized. Embryo:  Visualized. Cardiac Activity: Not visualized. Heart Rate: Not visualized. CRL:   13.1  mm   7 w 4 d                  [place] EDC: 06/08/2016 Subchorionic hemorrhage:  None visualized. Maternal uterus/adnexae: Ovaries are within normal. Corpus luteal cyst right ovary. Small amount of simple free pelvic fluid. IMPRESSION: Definitive evidence for failed pregnancy with crown-rump length greater than 7 mm and no cardiac activity detected. Electronically Signed   By: Marin Olp M.D.   On: 10/25/2015 11:27   US Ob Transvaginal  10/25/2015  CLINICAL DATA:  Vaginal bleeding. History states line IUP 7 weeks 2 days noted on ultrasound last week at physician's office. Unsure of LMP. History of recent spontaneous abortion. EXAM: OBSTETRIC <14 WK ULTRASOUND TECHNIQUE: Transabdominal ultrasound was performed for evaluation of the gestation as well as the maternal uterus and adnexal regions. COMPARISON:  None. FINDINGS: Intrauterine gestational sac: Single. Yolk sac:  Visualized. Embryo:  Visualized. Cardiac Activity: Not visualized. Heart Rate: Not visualized. CRL:   13.1  mm   7 w 4 d                  [place] EDC: 06/08/2016 Subchorionic hemorrhage:  None visualized. Maternal uterus/adnexae: Ovaries are within normal. Corpus luteal cyst right ovary. Small amount of simple free pelvic fluid. IMPRESSION: Definitive evidence  for failed pregnancy with crown-rump length greater than 7 mm and no cardiac activity detected. Electronically Signed   By: Marin Olp M.D.   On: 10/25/2015 11:27    Assessment: Renee Jensen is  20 y.o. G2P0010 at [redacted]w[redacted]d presents with SAB confirmed with u/s. Bleeding is mild and patient is hemodynamically stable. Rh positive. Dr. Philis Pique, patient's OB, has been notified, with plan for D & C.   Renee Jensen 5/13/201712:25 PM

## 2015-10-25 NOTE — Anesthesia Procedure Notes (Signed)
Procedure Name: LMA Insertion Date/Time: 10/25/2015 1:34 PM Performed by: Barkley Boards L Pre-anesthesia Checklist: Patient identified, Emergency Drugs available, Suction available and Patient being monitored Patient Re-evaluated:Patient Re-evaluated prior to inductionOxygen Delivery Method: Circle system utilized Preoxygenation: Pre-oxygenation with 100% oxygen Intubation Type: IV induction Ventilation: Mask ventilation without difficulty LMA: LMA inserted LMA Size: 4.0 Number of attempts: 1 Placement Confirmation: positive ETCO2 and breath sounds checked- equal and bilateral Dental Injury: Teeth and Oropharynx as per pre-operative assessment

## 2015-10-25 NOTE — H&P (Signed)
20 y.o. yo G27P0010 [redacted]w[redacted]d with MAB at 7 weeks confirmed by [place] today after having some bleeding and abdominal pain.  Pt desires D&C.  Past Medical History  Diagnosis Date  . Pseudotumor cerebri    Past Surgical History  Procedure Laterality Date  . Cholecystectomy    . Adenoidectomy    . Tympanostomy tube placement    . Dilation and evacuation N/A 06/27/2015    Procedure: DILATATION AND EVACUATION;  Surgeon: Bobbye Charleston, MD;  Location: [facility];  Service: Gynecology;  Laterality: N/A;    Social History   Social History  . Marital Status: Married    Spouse Name: N/A  . Number of Children: N/A  . Years of Education: N/A   Occupational History  . Not on file.   Social History Main Topics  . Smoking status: Never Smoker   . Smokeless tobacco: Not on file  . Alcohol Use: No  . Drug Use: No  . Sexual Activity: Yes   Other Topics Concern  . Not on file   Social History Narrative    No current facility-administered medications on file prior to encounter.   Current Outpatient Prescriptions on File Prior to Encounter  Medication Sig Dispense Refill  . acetaminophen (TYLENOL) 160 MG/5ML liquid Take 500 mg by mouth every 4 (four) hours as needed for pain.    Marland Kitchen levothyroxine (SYNTHROID, LEVOTHROID) 112 MCG tablet Take 112 mcg by mouth daily before breakfast.      Allergies  Allergen Reactions  . Xanax [Alprazolam] Anxiety    Has opposite effect    Filed Vitals:   10/25/15 0952  BP: 122/68  Pulse: 100  Temp: 97.5 F (36.4 C)  TempSrc: Oral  Resp: 20  Height: 5\' 6"  (1.676 m)  Weight: 120.838 kg (266 lb 6.4 oz)    Lungs: clear to ascultation Cor:  RRR Abdomen:  soft, nontender, nondistended. Ex:  no cords, erythema Pelvic:  Cervix closed, uterus about 7 weeks.  A:  MAB by [place].  For D&E.    P: All risks, benefits and alternatives d/w patient and she desires to proceed.  Pt is documented A Pos.     Renee Jensen A

## 2015-10-25 NOTE — Discharge Instructions (Signed)

## 2015-10-25 NOTE — Anesthesia Postprocedure Evaluation (Signed)
Anesthesia Post Note  Patient: Renee Jensen  Procedure(s) Performed: Procedure(s) (LRB): DILATATION AND EVACUATION (N/A)  Patient location during evaluation: PACU Anesthesia Type: General Level of consciousness: awake and alert Pain management: pain level controlled Vital Signs Assessment: post-procedure vital signs reviewed and stable Respiratory status: spontaneous breathing, nonlabored ventilation, respiratory function stable and patient connected to nasal cannula oxygen Cardiovascular status: blood pressure returned to baseline and stable Postop Assessment: no signs of nausea or vomiting Anesthetic complications: no     Last Vitals:  Filed Vitals:   10/25/15 1430 10/25/15 1445  BP: 112/65 111/69  Pulse: 67 74  Temp:  36.9 C  Resp: 17 20    Last Pain:  Filed Vitals:   10/25/15 1456  PainSc: 0-No pain   Pain Goal:                 Montez Hageman

## 2015-10-25 NOTE — Anesthesia Preprocedure Evaluation (Signed)
Anesthesia Evaluation  Patient identified by MRN, date of birth, ID band Patient awake    Reviewed: Allergy & Precautions, NPO status , Patient's Chart, lab work & pertinent test results  Airway Mallampati: II  TM Distance: >3 FB Neck ROM: Full    Dental no notable dental hx.    Pulmonary neg pulmonary ROS,    Pulmonary exam normal breath sounds clear to auscultation       Cardiovascular negative cardio ROS Normal cardiovascular exam Rhythm:Regular Rate:Normal     Neuro/Psych Pseudotumor cerebri  Idiopathic Intracranial Hypertension negative psych ROS   GI/Hepatic negative GI ROS, Neg liver ROS,   Endo/Other  Morbid obesity  Renal/GU negative Renal ROS  negative genitourinary   Musculoskeletal negative musculoskeletal ROS (+)   Abdominal   Peds negative pediatric ROS (+)  Hematology negative hematology ROS (+)   Anesthesia Other Findings   Reproductive/Obstetrics negative OB ROS                             Anesthesia Physical Anesthesia Plan  ASA: III  Anesthesia Plan: General   Post-op Pain Management:    Induction: Intravenous  Airway Management Planned: LMA  Additional Equipment:   Intra-op Plan:   Post-operative Plan:   Informed Consent: I have reviewed the patients History and Physical, chart, labs and discussed the procedure including the risks, benefits and alternatives for the proposed anesthesia with the patient or authorized representative who has indicated his/her understanding and acceptance.   Dental advisory given  Plan Discussed with: CRNA  Anesthesia Plan Comments:         Anesthesia Quick Evaluation

## 2015-10-25 NOTE — Op Note (Signed)
10/25/2015  1:51 PM  PATIENT:  Renee Jensen  20 y.o. female  PRE-OPERATIVE DIAGNOSIS:  missed abortion  POST-OPERATIVE DIAGNOSIS:  missed abortion  PROCEDURE:  Procedure(s): DILATATION AND EVACUATION (N/A)  SURGEON:  Surgeon(s) and Role:    * Bobbye Charleston, MD - Primary   ANESTHESIA:   general  EBL:  Total I/O In: -  Out: 90 [Urine:40; Blood:50]  SPECIMEN:  Source of Specimen:  uterine curettings  DISPOSITION OF SPECIMEN:  PATHOLOGY  COUNTS:  YES  TOURNIQUET:  * No tourniquets in log *  DICTATION: .Note written in EPIC  PLAN OF CARE: Discharge to home after PACU  PATIENT DISPOSITION:  PACU - hemodynamically stable.   Delay start of Pharmacological VTE agent (>24hrs) due to surgical blood loss or risk of bleeding: not applicable  Medications: Methergine  Complications: None  Findings:  8 week size uterus to 7 size post procedure.  Good crie was achieved.  After adequate anesthesia was achieved, the patient was prepped and draped in the usual sterile fashion.  The speculum was placed in the vagina and the cervix stabilized with a single-tooth tenaculum.  The cervix was dilated with Kennon Rounds dilators and the 9 mm curette was used to remove contents of the uterus.  Alternating sharp curettage with a curette and suction curettage was performed until all contents were removed and good crie was achieved.  All instruments were removed from the vagina.  The patient tolerated the procedure well.    Renee Jensen A

## 2015-10-25 NOTE — MAU Note (Signed)
Pt states she has been bleeding like a light period for 2 days. Pt comes today for "severe pain" that she describes as shooting and sharp in lower abdomen and back. Pt had an ultrasound last week in the office that showed a live IUP 7 weeks 2 days.

## 2015-10-27 ENCOUNTER — Encounter (HOSPITAL_COMMUNITY): Payer: Self-pay | Admitting: Obstetrics and Gynecology

## 2015-10-27 LAB — GC/CHLAMYDIA PROBE AMP (~~LOC~~) NOT AT ARMC
CHLAMYDIA, DNA PROBE: NEGATIVE
NEISSERIA GONORRHEA: NEGATIVE

## 2015-10-27 SURGERY — DILATION AND EVACUATION, UTERUS
Anesthesia: Choice

## 2016-06-14 HISTORY — PX: TONSILLECTOMY: SUR1361

## 2016-08-29 ENCOUNTER — Encounter (HOSPITAL_COMMUNITY): Payer: Self-pay

## 2018-12-07 ENCOUNTER — Other Ambulatory Visit: Payer: Self-pay

## 2018-12-08 ENCOUNTER — Encounter: Payer: Self-pay | Admitting: Family Medicine

## 2018-12-08 ENCOUNTER — Ambulatory Visit (INDEPENDENT_AMBULATORY_CARE_PROVIDER_SITE_OTHER): Payer: BC Managed Care – PPO | Admitting: Family Medicine

## 2018-12-08 VITALS — BP 110/78 | HR 102 | Temp 98.0°F | Ht 67.0 in | Wt 287.2 lb

## 2018-12-08 DIAGNOSIS — F411 Generalized anxiety disorder: Secondary | ICD-10-CM

## 2018-12-08 DIAGNOSIS — E039 Hypothyroidism, unspecified: Secondary | ICD-10-CM

## 2018-12-08 DIAGNOSIS — F902 Attention-deficit hyperactivity disorder, combined type: Secondary | ICD-10-CM

## 2018-12-08 DIAGNOSIS — Z7689 Persons encountering health services in other specified circumstances: Secondary | ICD-10-CM

## 2018-12-08 DIAGNOSIS — F322 Major depressive disorder, single episode, severe without psychotic features: Secondary | ICD-10-CM

## 2018-12-08 DIAGNOSIS — Z2821 Immunization not carried out because of patient refusal: Secondary | ICD-10-CM | POA: Diagnosis not present

## 2018-12-08 MED ORDER — AMPHETAMINE-DEXTROAMPHET ER 15 MG PO CP24: 15.0000 mg | ORAL_CAPSULE | ORAL | 0 refills | Status: DC

## 2018-12-08 MED ORDER — FLUOXETINE HCL 20 MG PO TABS: 20.0000 mg | ORAL_TABLET | Freq: Every day | ORAL | 0 refills | Status: DC

## 2018-12-08 MED ORDER — BUPROPION HCL ER (XL) 300 MG PO TB24: 300.0000 mg | ORAL_TABLET | Freq: Every day | ORAL | 3 refills | Status: DC

## 2018-12-08 NOTE — Patient Instructions (Signed)
Taper down/off prozac. Take 20mg  daily x 1 week then stop Cont with buspar Adderall changed to long acting 15mg  1 tab daily Wellbutrin increased to 300mg  daily Follow-up in 3-4 wks or sooner if needed  Referral for thyroid US Thyroid function tests today

## 2018-12-08 NOTE — Progress Notes (Signed)
Renee Jensen is a 23 y.o. female  Chief Complaint  Patient presents with  . Establish Care    est care/ wants to discuss medication/ denies tdap    HPI: Renee Jensen is a 23 y.o. female here to establish care with our office. Her previous PCP was Haynes Hoehn, NP in [place]. Pt works at a doctors office since 02/2018.  She declines Tdap.  She was diagnosed with hypothyroidism around 23yo. She has not has TFTs done in > 1 year.  Pt was on prozac in 2018 (during marital separation and divorce) then off until 2019 when it was restarted at 10mg  then increased. Buspar was added starting at 5mg  and increased. Wellbutrin was then added and then finally adderall was started. Pt sees a counselor every 2 wks. She has been with current therapist at [facility] since 04/2018. She has never seen a psychiatrist.    GAD 7 : Generalized Anxiety Score 12/08/2018  Nervous, Anxious, on Edge 3  Control/stop worrying 3  Worry too much - different things 3  Trouble relaxing 3  Restless 3  Easily annoyed or irritable 3  Afraid - awful might happen 3  Total GAD 7 Score 21    Depression screen PHQ 2/9 12/08/2018  Decreased Interest 2  Down, Depressed, Hopeless 3  PHQ - 2 Score 5  Altered sleeping 3  Tired, decreased energy 3  Change in appetite 2  Feeling bad or failure about yourself  3  Trouble concentrating 3  Moving slowly or fidgety/restless 3  Suicidal thoughts 1  PHQ-9 Score 23   She denies HA, dizziness, CP, SOB, n/v/d/c. She denies SI.   Past Medical History:  Diagnosis Date  . ADHD   . Anxiety   . Depression   . Pseudotumor cerebri   . Thyroid disease     Past Surgical History:  Procedure Laterality Date  . ADENOIDECTOMY    . CHOLECYSTECTOMY    . DILATION AND EVACUATION N/A 06/27/2015   Procedure: DILATATION AND EVACUATION;  Surgeon: Bobbye Charleston, MD;  Location: [facility];  Service: Gynecology;  Laterality: N/A;  . DILATION AND EVACUATION N/A 10/25/2015   Procedure: DILATATION AND EVACUATION;  Surgeon: Bobbye Charleston, MD;  Location: [facility];  Service: Gynecology;  Laterality: N/A;  . TYMPANOSTOMY TUBE PLACEMENT      Social History   Socioeconomic History  . Marital status: Single    Spouse name: Not on file  . Number of children: Not on file  . Years of education: Not on file  . Highest education level: Not on file  Occupational History  . Not on file  Social Needs  . Financial resource strain: Not on file  . Food insecurity    Worry: Not on file    Inability: Not on file  . Transportation needs    Medical: Not on file    Non-medical: Not on file  Tobacco Use  . Smoking status: Never Smoker  . Smokeless tobacco: Never Used  Substance and Sexual Activity  . Alcohol use: Yes  . Drug use: No  . Sexual activity: Yes  Lifestyle  . Physical activity    Days per week: Not on file    Minutes per session: Not on file  . Stress: Not on file  Relationships  . Social Herbalist on phone: Not on file    Gets together: Not on file    Attends religious service: Not on file    Active member  of club or organization: Not on file    Attends meetings of clubs or organizations: Not on file    Relationship status: Not on file  . Intimate partner violence    Fear of current or ex partner: Not on file    Emotionally abused: Not on file    Physically abused: Not on file    Forced sexual activity: Not on file  Other Topics Concern  . Not on file  Social History Narrative  . Not on file    Family History  Problem Relation Age of Onset  . Ulcerative colitis Mother   . Rheum arthritis Mother   . Fibromyalgia Mother   . Hypothyroidism Mother   . Kidney disease Mother   . Diabetes Father   . Hypertension Father   . Heart disease Father   . Hyperlipidemia Father       There is no immunization history on file for this patient.  Outpatient Encounter Medications as of 12/08/2018  Medication Sig  .  amphetamine-dextroamphetamine (ADDERALL) 20 MG tablet Adderall 20 mg tablet  Take 1 tablet every day by oral route.  Marland Kitchen buPROPion (WELLBUTRIN) 75 MG tablet 150 mg.   . busPIRone (BUSPAR) 10 MG tablet 10 mg 2 (two) times daily.   Marland Kitchen FLUoxetine (PROZAC) 40 MG capsule Prozac 40 mg capsule   1 capsule every day by oral route.  Marland Kitchen levothyroxine (SYNTHROID, LEVOTHROID) 112 MCG tablet Take 112 mcg by mouth daily before breakfast.  . acetaminophen (TYLENOL) 160 MG/5ML liquid Take 500 mg by mouth every 4 (four) hours as needed for pain.  . Prenatal Vit-Fe Fumarate-FA (MULTIVITAMIN-PRENATAL) 27-0.8 MG TABS tablet Take 1 tablet by mouth daily at 12 noon.   No facility-administered encounter medications on file as of 12/08/2018.      ROS: Pertinent positives and negatives noted in HPI. Remainder of ROS non-contributory     Allergies  Allergen Reactions  . Alprazolam Anxiety and Other (See Comments)    Has opposite effect Hysterical crying, unable to walk and talk Hysterical crying, unable to walk and talk     BP 110/78   Pulse (!) 102   Temp 98 F (36.7 C) (Oral)   Ht 5\' 7"  (1.702 m)   Wt 287 lb 3.2 oz (130.3 kg)   SpO2 98%   BMI 44.98 kg/m   Physical Exam  Constitutional: She is oriented to person, place, and time. She appears well-developed and well-nourished. No distress.  Neck: Neck supple. Thyromegaly present.  Cardiovascular: Normal rate, regular rhythm and normal heart sounds.  Pulmonary/Chest: Effort normal and breath sounds normal. No respiratory distress.  Neurological: She is alert and oriented to person, place, and time.  Psychiatric: She has a normal mood and affect.     A/P:  1. Encounter to establish care with new doctor  2. Tetanus, diphtheria, and acellular pertussis (Tdap) vaccination declined  3. Generalized anxiety disorder 4. Depression, major, single episode, severe (HCC) - PHQ-9 = 23, GAD-7 = 21 - titrate down from prozac 40mg  and then d/c - pt will  take 20mg  daily x 1 week then stop - FLUoxetine (PROZAC) 20 MG tablet; Take 1 tablet (20 mg total) by mouth daily.  Dispense: 7 tablet; Refill: 0 Increase: - buPROPion (WELLBUTRIN XL) 300 MG 24 hr tablet; Take 1 tablet (300 mg total) by mouth daily.  Dispense: 90 tablet; Refill: 3 - cont buspar 10mg  BID - cont with BH counseling every 2 wks - f/u in 3-4 wks, sooner PRN  5. Hypothyroidism, unspecified type - cont current med/dose at this time - T4, free - TSH - T3 - US THYROID; Future - Thyroid peroxidase antibody - Thyroid stimulating immunoglobulin  6. Attention deficit hyperactivity disorder (ADHD), combined type - stop adderall 20mg  Rx: - amphetamine-dextroamphetamine (ADDERALL XR) 15 MG 24 hr capsule; Take 1 capsule by mouth every morning.  Dispense: 30 capsule; Refill: 0 - f/u in 3-4 wks or sooner PRN

## 2018-12-11 LAB — TSH: TSH: 2.6 u[IU]/mL (ref 0.450–4.500)

## 2018-12-11 LAB — T3: T3, Total: 105 ng/dL (ref 71–180)

## 2018-12-11 LAB — THYROID PEROXIDASE ANTIBODY: Thyroperoxidase Ab SerPl-aCnc: 288 IU/mL — ABNORMAL HIGH (ref 0–34)

## 2018-12-11 LAB — THYROID STIMULATING IMMUNOGLOBULIN: Thyroid Stim Immunoglobulin: <0.1 IU/L (ref 0.00–0.55)

## 2018-12-11 LAB — T4, FREE: Free T4: 0.88 ng/dL (ref 0.82–1.77)

## 2018-12-28 ENCOUNTER — Telehealth: Payer: Self-pay

## 2018-12-28 NOTE — Telephone Encounter (Signed)
Questions for Screening COVID-19  Symptom onset: n/a Travel or Contacts: no  During this illness, did/does the patient experience any of the following symptoms? Fever >100.47F []   Yes [x]   No []   Unknown Subjective fever (felt feverish) []   Yes [x]   No []   Unknown Chills []   Yes [x]   No []   Unknown Muscle aches (myalgia) []   Yes [x]   No []   Unknown Runny nose (rhinorrhea) []   Yes [x]   No []   Unknown Sore throat []   Yes [x]   No []   Unknown Cough (new onset or worsening of chronic cough) []   Yes [x]   No []   Unknown Shortness of breath (dyspnea) []   Yes [x]   No []   Unknown Nausea or vomiting []   Yes [x]   No []   Unknown Headache []   Yes [x]   No []   Unknown Abdominal pain  []   Yes [x]   No []   Unknown Diarrhea (?3 loose/looser than normal stools/24hr period) []   Yes [x]   No []   Unknown Other, specify:  Patient risk factors: Smoker? []   Current []   Former []   Never If female, currently pregnant? []   Yes []   No  Patient Active Problem List   Diagnosis Date Noted  . Missed abortion 10/25/2015    Plan:  []   High risk for COVID-19 with red flags go to ED (with CP, SOB, weak/lightheaded, or fever > 101.5). Call ahead.  []   High risk for COVID-19 but stable. Inform provider and coordinate time for [facility] visit.   []   No red flags but URI signs or symptoms okay for [facility] visit.

## 2018-12-29 ENCOUNTER — Encounter: Payer: Self-pay | Admitting: Family Medicine

## 2018-12-29 ENCOUNTER — Ambulatory Visit (INDEPENDENT_AMBULATORY_CARE_PROVIDER_SITE_OTHER): Payer: BC Managed Care – PPO | Admitting: Family Medicine

## 2018-12-29 VITALS — BP 120/70 | HR 111 | Temp 98.0°F | Ht 67.0 in | Wt 288.0 lb

## 2018-12-29 DIAGNOSIS — F902 Attention-deficit hyperactivity disorder, combined type: Secondary | ICD-10-CM | POA: Diagnosis not present

## 2018-12-29 DIAGNOSIS — F411 Generalized anxiety disorder: Secondary | ICD-10-CM | POA: Diagnosis not present

## 2018-12-29 DIAGNOSIS — F322 Major depressive disorder, single episode, severe without psychotic features: Secondary | ICD-10-CM | POA: Diagnosis not present

## 2018-12-29 MED ORDER — ESCITALOPRAM OXALATE 10 MG PO TABS: 10.0000 mg | ORAL_TABLET | Freq: Every day | ORAL | 3 refills | Status: DC

## 2018-12-29 MED ORDER — AMPHETAMINE-DEXTROAMPHET ER 25 MG PO CP24: 25.0000 mg | ORAL_CAPSULE | ORAL | 0 refills | Status: DC

## 2018-12-29 NOTE — Progress Notes (Addendum)
Chief Complaint  Patient presents with  . Follow-up    3 week f/u-- needs to talk about dosage    HPI: Renee Jensen is a 23 y.o. female here to f/u on ADHD, anxiety, and depression. At last OV on 12/08/18, we weaned pt off prozac, increased her wellbutrin to 300mg  daily, continued her buspar 10mg  BID, and switched to her Adderall XR 15mg  daily from Adderall 20mg . She feels her anxiety is the same but depression is worse. She likes the longer acting adderall but feels the dose may not be enough. No side effects. She is still seeing her therapist every 2 weeks. She does not see a psychiatrist.    Depression screen [facility] 2/9 12/29/2018 12/08/2018  Decreased Interest 3 2  Down, Depressed, Hopeless 3 3  PHQ - 2 Score 6 5  Altered sleeping 3 3  Tired, decreased energy 3 3  Change in appetite 2 2  Feeling bad or failure about yourself  3 3  Trouble concentrating 3 3  Moving slowly or fidgety/restless 3 3  Suicidal thoughts 2 1  PHQ-9 Score 25 23    GAD 7 : Generalized Anxiety Score 12/29/2018 12/08/2018  Nervous, Anxious, on Edge 3 3  Control/stop worrying 3 3  Worry too much - different things 3 3  Trouble relaxing 3 3  Restless 3 3  Easily annoyed or irritable 3 3  Afraid - awful might happen 3 3  Total GAD 7 Score 21 21      Past Medical History:  Diagnosis Date  . ADHD   . Anxiety   . Depression   . Pseudotumor cerebri   . Thyroid disease     Past Surgical History:  Procedure Laterality Date  . ADENOIDECTOMY    . CHOLECYSTECTOMY    . DILATION AND EVACUATION N/A 06/27/2015   Procedure: DILATATION AND EVACUATION;  Surgeon: Bobbye Charleston, MD;  Location: [facility];  Service: Gynecology;  Laterality: N/A;  . DILATION AND EVACUATION N/A 10/25/2015   Procedure: DILATATION AND EVACUATION;  Surgeon: Bobbye Charleston, MD;  Location: [facility];  Service: Gynecology;  Laterality: N/A;  . TYMPANOSTOMY TUBE PLACEMENT      Social History   Socioeconomic History  . Marital status:  Single    Spouse name: Not on file  . Number of children: Not on file  . Years of education: Not on file  . Highest education level: Not on file  Occupational History  . Not on file  Social Needs  . Financial resource strain: Not on file  . Food insecurity    Worry: Not on file    Inability: Not on file  . Transportation needs    Medical: Not on file    Non-medical: Not on file  Tobacco Use  . Smoking status: Never Smoker  . Smokeless tobacco: Never Used  Substance and Sexual Activity  . Alcohol use: Yes  . Drug use: No  . Sexual activity: Yes  Lifestyle  . Physical activity    Days per week: Not on file    Minutes per session: Not on file  . Stress: Not on file  Relationships  . Social Herbalist on phone: Not on file    Gets together: Not on file    Attends religious service: Not on file    Active member of club or organization: Not on file    Attends meetings of clubs or organizations: Not on file    Relationship status: Not  on file  . Intimate partner violence    Fear of current or ex partner: Not on file    Emotionally abused: Not on file    Physically abused: Not on file    Forced sexual activity: Not on file  Other Topics Concern  . Not on file  Social History Narrative  . Not on file    Family History  Problem Relation Age of Onset  . Ulcerative colitis Mother   . Rheum arthritis Mother   . Fibromyalgia Mother   . Hypothyroidism Mother   . Kidney disease Mother   . Diabetes Father   . Hypertension Father   . Heart disease Father   . Hyperlipidemia Father       There is no immunization history on file for this patient.  Outpatient Encounter Medications as of 12/29/2018  Medication Sig  . buPROPion (WELLBUTRIN XL) 300 MG 24 hr tablet Take 1 tablet (300 mg total) by mouth daily.  . busPIRone (BUSPAR) 10 MG tablet 10 mg 2 (two) times daily.   Marland Kitchen levothyroxine (SYNTHROID, LEVOTHROID) 112 MCG tablet Take 112 mcg by mouth daily before  breakfast.  . [DISCONTINUED] amphetamine-dextroamphetamine (ADDERALL XR) 15 MG 24 hr capsule Take 1 capsule by mouth every morning.  Marland Kitchen amphetamine-dextroamphetamine (ADDERALL XR) 25 MG 24 hr capsule Take 1 capsule by mouth every morning.  . escitalopram (LEXAPRO) 10 MG tablet Take 1 tablet (10 mg total) by mouth daily.  Marland Kitchen FLUoxetine (PROZAC) 20 MG tablet Take 1 tablet (20 mg total) by mouth daily. (Patient not taking: Reported on 12/29/2018)   No facility-administered encounter medications on file as of 12/29/2018.      ROS: Gen: no fever, chills  Skin: no rash, itching ENT: no ear pain, ear drainage, nasal congestion, rhinorrhea, sinus pressure, sore throat Eyes: no blurry vision, double vision Resp: no cough, wheeze,SOB CV: no CP, palpitations, LE edema,  GI: no heartburn, n/v/d/c, abd pain GU: no dysuria, urgency, frequency, hematuria  MSK: no joint pain, myalgias, back pain Neuro: no dizziness, headache, weakness, vertigo Psych: as above in HPI   Allergies  Allergen Reactions  . Alprazolam Anxiety and Other (See Comments)    Has opposite effect Hysterical crying, unable to walk and talk Hysterical crying, unable to walk and talk     BP 120/70   Pulse (!) 111   Temp 98 F (36.7 C) (Oral)   Ht 5\' 7"  (1.702 m)   Wt 288 lb (130.6 kg)   SpO2 98%   BMI 45.11 kg/m   Physical Exam  Constitutional: She is oriented to person, place, and time. She appears well-developed and well-nourished. No distress.  Cardiovascular: Normal rate and regular rhythm.  Pulmonary/Chest: Effort normal and breath sounds normal.  Neurological: She is alert and oriented to person, place, and time.  Psychiatric: She has a normal mood and affect. Her behavior is normal.     A/P:  1. Depression, major, single episode, severe ([place]) - Ambulatory referral to Psychiatry - PHQ-9 = 25 (previously 23), no suicidal ideations - cont wellbutrin XL 300mg  daily Rx: - escitalopram (LEXAPRO) 10 MG tablet;  Take 1 tablet (10 mg total) by mouth daily.  Dispense: 90 tablet; Refill: 3 - cont with BH counseling - f/u in 3-4 wks or sooner PRN  2. Generalized anxiety disorder - Ambulatory referral to Psychiatry - GAD-7 = 21 (previously 21) - cont buspar 10mg  BID Rx: - escitalopram (LEXAPRO) 10 MG tablet; Take 1 tablet (10 mg total) by mouth daily.  Dispense: 90 tablet; Refill: 3 - f/u in 3-4 wks or sooner PRN  3. Attention deficit hyperactivity disorder (ADHD), combined type - Ambulatory referral to Psychiatry Increase: - amphetamine-dextroamphetamine (ADDERALL XR) 25 MG 24 hr capsule; Take 1 capsule by mouth every morning.  Dispense: 30 capsule; Refill: 0 - increased from 15mg  daily  Discussed plan and reviewed medications with patient, including risks, benefits, and potential side effects. Pt expressed understand. All questions answered.

## 2018-12-29 NOTE — Patient Instructions (Signed)
[facility] Address: [address], [place], [place] [address] Phone: [phone]

## 2019-01-03 ENCOUNTER — Encounter: Payer: Self-pay | Admitting: Family Medicine

## 2019-01-11 ENCOUNTER — Telehealth: Payer: Self-pay | Admitting: Family Medicine

## 2019-01-11 NOTE — Telephone Encounter (Signed)
[facility]) has returned call and and states you may call her at [phone]

## 2019-01-11 NOTE — Telephone Encounter (Signed)
Called and spoke with pt who states she was seen by Lillia Mountain, FNP at [facility] today. Pt is very upset with her appt there today. She states she was told she needed to go to [facility] ER and get admitted to psych unit. She states she was given the option to go herself or have police called to take her. She refused both of these and left the office. Pt denies active suicidal ideations. She is agreeable to inpatient psych treatment or intensive outpt program but she refuses to go thru the ER. Pt again denies active suicidal ideations or plan. I told pt I would reach out to Lillia Mountain to discuss her clinical impression and treatment options for the pt. Pt agreed to this.  Called and LM with RN at [facility] for Lillia Mountain, NP to discuss options for the patient. Will await call back.

## 2019-01-11 NOTE — Telephone Encounter (Signed)
Patient states that she has just left [facility], as referred, and they recommended her be admitted ED psych department. Patient declined. She is requesting call back from [place].

## 2019-01-26 ENCOUNTER — Encounter: Payer: Self-pay | Admitting: Family Medicine

## 2019-01-26 ENCOUNTER — Telehealth: Payer: BC Managed Care – PPO | Admitting: Family Medicine

## 2019-01-26 ENCOUNTER — Telehealth (INDEPENDENT_AMBULATORY_CARE_PROVIDER_SITE_OTHER): Payer: BC Managed Care – PPO | Admitting: Family Medicine

## 2019-01-26 ENCOUNTER — Other Ambulatory Visit: Payer: Self-pay | Admitting: Family Medicine

## 2019-01-26 DIAGNOSIS — F322 Major depressive disorder, single episode, severe without psychotic features: Secondary | ICD-10-CM

## 2019-01-26 DIAGNOSIS — F419 Anxiety disorder, unspecified: Secondary | ICD-10-CM

## 2019-01-26 DIAGNOSIS — Z6841 Body Mass Index (BMI) 40.0 and over, adult: Secondary | ICD-10-CM

## 2019-01-26 NOTE — Progress Notes (Signed)
Virtual Visit via Video Note  I connected with Renee Jensen on 01/26/19 at  3:00 PM EDT by a video enabled telemedicine application and verified that I am speaking with the correct person using two identifiers. Location patient: home Location provider: work  Persons participating in the virtual visit: patient, provider  I discussed the limitations of evaluation and management by telemedicine and the availability of in person appointments. The patient expressed understanding and agreed to proceed.  Chief Complaint  Patient presents with  . Follow-up     4 week follow up   HPI: HPI: Renee Jensen is a 23 y.o. female here to discuss concerns about anxiety/depression. She is currently on buspar 10mg  BID, lexapro 20mg  daily (this was increased from 10mg  to 20mg  by NP at [facility] on 01/11/19) , wellbutrin 300mg  daily. Pt was seen at [facility] initially on 01/11/19 and pt was not happy with experience. She was seen again there last week and states appt was 10 min and only recommendation was to make pts dog a "support animal". She does not plan to return there for f/u appt.  She was out of work x 1+ week and just returned on Monday 8/10.  She is still seeing counselor once every two weeks thru [place], most recently yesterday. Counselor thinks intensive outpatient program would be beneficial.  Pt denies SI.  Pt was in [place] this past weekend.  Pt also requests referral for gastric surgery. BMI 45.  Depression screen [facility] 2/9 01/26/2019 12/29/2018 12/08/2018  Decreased Interest 2 3 2   Down, Depressed, Hopeless 3 3 3   PHQ - 2 Score 5 6 5   Altered sleeping 3 3 3   Tired, decreased energy 3 3 3   Change in appetite 3 2 2   Feeling bad or failure about yourself  2 3 3   Trouble concentrating 3 3 3   Moving slowly or fidgety/restless 3 3 3   Suicidal thoughts 1 2 1   PHQ-9 Score 23 25 23    GAD 7 : Generalized Anxiety Score 01/26/2019 12/29/2018 12/08/2018  Nervous, Anxious, on Edge 3 3 3   Control/stop  worrying 3 3 3   Worry too much - different things 3 3 3   Trouble relaxing 3 3 3   Restless 3 3 3   Easily annoyed or irritable 3 3 3   Afraid - awful might happen 2 3 3   Total GAD 7 Score 20 21 21      Past Medical History:  Diagnosis Date  . ADHD   . Anxiety   . Depression   . Pseudotumor cerebri   . Thyroid disease     Past Surgical History:  Procedure Laterality Date  . ADENOIDECTOMY    . CHOLECYSTECTOMY    . DILATION AND EVACUATION N/A 06/27/2015   Procedure: DILATATION AND EVACUATION;  Surgeon: Bobbye Charleston, MD;  Location: [facility];  Service: Gynecology;  Laterality: N/A;  . DILATION AND EVACUATION N/A 10/25/2015   Procedure: DILATATION AND EVACUATION;  Surgeon: Bobbye Charleston, MD;  Location: [facility];  Service: Gynecology;  Laterality: N/A;  . TYMPANOSTOMY TUBE PLACEMENT      Family History  Problem Relation Age of Onset  . Ulcerative colitis Mother   . Rheum arthritis Mother   . Fibromyalgia Mother   . Hypothyroidism Mother   . Kidney disease Mother   . Diabetes Father   . Hypertension Father   . Heart disease Father   . Hyperlipidemia Father     Social History   Tobacco Use  . Smoking status: Never Smoker  . Smokeless  tobacco: Never Used  Substance Use Topics  . Alcohol use: Yes  . Drug use: No     Current Outpatient Medications:  .  amphetamine-dextroamphetamine (ADDERALL XR) 25 MG 24 hr capsule, Take 1 capsule by mouth every morning., Disp: 30 capsule, Rfl: 0 .  buPROPion (WELLBUTRIN XL) 300 MG 24 hr tablet, Take 1 tablet (300 mg total) by mouth daily., Disp: 90 tablet, Rfl: 3 .  busPIRone (BUSPAR) 10 MG tablet, 10 mg 2 (two) times daily. , Disp: , Rfl:  .  escitalopram (LEXAPRO) 10 MG tablet, Take 1 tablet (10 mg total) by mouth daily., Disp: 90 tablet, Rfl: 3 .  levothyroxine (SYNTHROID, LEVOTHROID) 112 MCG tablet, Take 112 mcg by mouth daily before breakfast., Disp: , Rfl:   Allergies  Allergen Reactions  . Alprazolam Anxiety and Other (See  Comments)    Has opposite effect Hysterical crying, unable to walk and talk Hysterical crying, unable to walk and talk       ROS: See pertinent positives and negatives per HPI.   EXAM:  VITALS per patient if applicable:  GENERAL: alert, oriented, appears well and in no acute distress  HEENT: atraumatic, conjunctiva clear, no obvious abnormalities on inspection of external nose and ears  NECK: normal movements of the head and neck  LUNGS: on inspection no signs of respiratory distress, breathing rate appears normal, no obvious gross SOB, gasping or wheezing, no conversational dyspnea  CV: no obvious cyanosis  MS: moves all visible extremities without noticeable abnormality  PSYCH/NEURO: pleasant and cooperative, speech and thought processing grossly intact   ASSESSMENT AND PLAN:  1. BMI 45.0-49.9, adult (HCC) - Amb Referral to Bariatric Surgery  2. Anxiety 3. Depression, major, single episode, severe ([place]) - still not well-controlled - pt seen twice by psych at [facility] but is not happy with care and does not plan to return - she is still seeing [facility] counselor every 2 weeks and counselor agrees with me that the pt could benefit from intensive outpatient program. Pt would like to do that thru [facility]. I will look into this but pt could also consider referral to psych at [facility]. Will get back to pt early next week with additional info    I discussed the assessment and treatment plan with the patient. The patient was provided an opportunity to ask questions and all were answered. The patient agreed with the plan and demonstrated an understanding of the instructions.   The patient was advised to call back or seek an in-person evaluation if the symptoms worsen or if the condition fails to improve as anticipated.   Letta Median, DO

## 2019-01-29 ENCOUNTER — Encounter: Payer: Self-pay | Admitting: Family Medicine

## 2019-02-20 ENCOUNTER — Other Ambulatory Visit: Payer: Self-pay | Admitting: Family Medicine

## 2019-02-20 DIAGNOSIS — F902 Attention-deficit hyperactivity disorder, combined type: Secondary | ICD-10-CM

## 2019-02-21 MED ORDER — AMPHETAMINE-DEXTROAMPHET ER 25 MG PO CP24: 25.0000 mg | ORAL_CAPSULE | ORAL | 0 refills | Status: DC

## 2019-02-21 NOTE — Telephone Encounter (Signed)
Baldo Ash please advise in absence of Dr. Loletha Grayer  Last ov and refill was 12/29/2018 for 30 tab  Unable to pull up PMP for report--pls help

## 2019-03-07 ENCOUNTER — Telehealth: Payer: Self-pay | Admitting: Family Medicine

## 2019-03-07 ENCOUNTER — Other Ambulatory Visit: Payer: Self-pay | Admitting: Nurse Practitioner

## 2019-03-07 DIAGNOSIS — F902 Attention-deficit hyperactivity disorder, combined type: Secondary | ICD-10-CM

## 2019-03-07 MED ORDER — AMPHETAMINE-DEXTROAMPHET ER 25 MG PO CP24: 25.0000 mg | ORAL_CAPSULE | ORAL | 0 refills | Status: DC

## 2019-03-07 NOTE — Telephone Encounter (Signed)
Dr. Loletha Grayer please advise it looks like the prescription was printed.

## 2019-03-07 NOTE — Telephone Encounter (Signed)
Will resend electronically. Please shred paper Rx

## 2019-03-07 NOTE — Addendum Note (Signed)
Addended by: Ronnald Nian on: 03/07/2019 02:19 PM   Modules accepted: Orders

## 2019-03-07 NOTE — Telephone Encounter (Signed)
Pharmacy called stating they had not received this prescription refill today. Please advise.

## 2019-06-11 ENCOUNTER — Other Ambulatory Visit: Payer: Self-pay | Admitting: Family Medicine

## 2019-06-11 DIAGNOSIS — F902 Attention-deficit hyperactivity disorder, combined type: Secondary | ICD-10-CM

## 2019-06-12 MED ORDER — AMPHETAMINE-DEXTROAMPHET ER 25 MG PO CP24: 25.0000 mg | ORAL_CAPSULE | ORAL | 0 refills | Status: DC

## 2019-06-15 HISTORY — PX: OTHER SURGICAL HISTORY: SHX169

## 2019-06-20 ENCOUNTER — Telehealth (INDEPENDENT_AMBULATORY_CARE_PROVIDER_SITE_OTHER): Payer: BC Managed Care – PPO | Admitting: Family Medicine

## 2019-06-20 ENCOUNTER — Encounter: Payer: Self-pay | Admitting: Family Medicine

## 2019-06-20 VITALS — Ht 67.0 in | Wt 280.0 lb

## 2019-06-20 DIAGNOSIS — F321 Major depressive disorder, single episode, moderate: Secondary | ICD-10-CM

## 2019-06-20 DIAGNOSIS — F411 Generalized anxiety disorder: Secondary | ICD-10-CM | POA: Diagnosis not present

## 2019-06-20 MED ORDER — BUSPIRONE HCL 10 MG PO TABS: 10.0000 mg | ORAL_TABLET | Freq: Three times a day (TID) | ORAL | 3 refills | Status: DC

## 2019-06-20 MED ORDER — ESCITALOPRAM OXALATE 20 MG PO TABS: 20.0000 mg | ORAL_TABLET | Freq: Every day | ORAL | 3 refills | Status: DC

## 2019-06-20 NOTE — Progress Notes (Signed)
Virtual Visit via Video Note  I connected with Renee Jensen on 06/20/19 at  1:00 PM EST by a video enabled telemedicine application and verified that I am speaking with the correct person using two identifiers. Location patient: home Location provider:  home office Persons participating in the virtual visit: patient, provider  I discussed the limitations of evaluation and management by telemedicine and the availability of in person appointments. The patient expressed understanding and agreed to proceed.  Chief Complaint  Patient presents with  . Follow-up  . Anxiety    Gad Score: 20 PHQ:13      HPI: Renee Jensen is a 23 y.o. female with a h/o anxiety and depression who notes symptoms overall being well-controlled for the past few months. However, in the last 3-4 wks, she notes increased anxiety symptoms including physical symptoms of anxiety - chest feels tight, episodes of shortness of breath. She is also having trouble falling and staying asleep, and feels like she can't shut her mind off.  Appetite is good. Work stress with covid pandemic. Some personal/work stress.  She has not established with psych. She is taking lexapro 10mg  daily, wellbutrin 300mg  daily, buspar 10-20mg  daily in AM (she states she is "terrible" at taking meds at night so takes all in AM).  GAD 7 : Generalized Anxiety Score 06/20/2019 01/26/2019 12/29/2018 12/08/2018  Nervous, Anxious, on Edge 3 3 3 3   Control/stop worrying 3 3 3 3   Worry too much - different things 3 3 3 3   Trouble relaxing 3 3 3 3   Restless 3 3 3 3   Easily annoyed or irritable 2 3 3 3   Afraid - awful might happen 3 2 3 3   Total GAD 7 Score 20 20 21 21     Depression screen [facility] 2/9 06/20/2019 01/26/2019 12/29/2018  Decreased Interest 2 2 3   Down, Depressed, Hopeless 2 3 3   PHQ - 2 Score 4 5 6   Altered sleeping 3 3 3   Tired, decreased energy 2 3 3   Change in appetite 0 3 2  Feeling bad or failure about yourself  1 2 3   Trouble concentrating 1 3  3   Moving slowly or fidgety/restless 2 3 3   Suicidal thoughts 0 1 2  PHQ-9 Score 13 23 25      Past Medical History:  Diagnosis Date  . ADHD   . Anxiety   . Depression   . Pseudotumor cerebri   . Thyroid disease     Past Surgical History:  Procedure Laterality Date  . ADENOIDECTOMY    . CHOLECYSTECTOMY    . DILATION AND EVACUATION N/A 06/27/2015   Procedure: DILATATION AND EVACUATION;  Surgeon: Bobbye Charleston, MD;  Location: [facility];  Service: Gynecology;  Laterality: N/A;  . DILATION AND EVACUATION N/A 10/25/2015   Procedure: DILATATION AND EVACUATION;  Surgeon: Bobbye Charleston, MD;  Location: [facility];  Service: Gynecology;  Laterality: N/A;  . TYMPANOSTOMY TUBE PLACEMENT      Family History  Problem Relation Age of Onset  . Ulcerative colitis Mother   . Rheum arthritis Mother   . Fibromyalgia Mother   . Hypothyroidism Mother   . Kidney disease Mother   . Diabetes Father   . Hypertension Father   . Heart disease Father   . Hyperlipidemia Father     Social History   Tobacco Use  . Smoking status: Never Smoker  . Smokeless tobacco: Never Used  Substance Use Topics  . Alcohol use: Yes  . Drug use: No  Current Outpatient Medications:  .  amphetamine-dextroamphetamine (ADDERALL XR) 25 MG 24 hr capsule, Take 1 capsule by mouth every morning., Disp: 30 capsule, Rfl: 0 .  buPROPion (WELLBUTRIN XL) 300 MG 24 hr tablet, Take 1 tablet (300 mg total) by mouth daily., Disp: 90 tablet, Rfl: 3 .  busPIRone (BUSPAR) 10 MG tablet, 10 mg 2 (two) times daily. , Disp: , Rfl:  .  escitalopram (LEXAPRO) 10 MG tablet, Take 1 tablet (10 mg total) by mouth daily., Disp: 90 tablet, Rfl: 3 .  levothyroxine (SYNTHROID, LEVOTHROID) 112 MCG tablet, Take 112 mcg by mouth daily before breakfast., Disp: , Rfl:   Allergies  Allergen Reactions  . Diazepam Anxiety, Other (See Comments) and Shortness Of Breath    Severe anxiety Severe anxiety   . Alprazolam Anxiety and Other (See  Comments)    Has opposite effect Hysterical crying, unable to walk and talk Hysterical crying, unable to walk and talk       ROS: See pertinent positives and negatives per HPI.   EXAM:  VITALS per patient if applicable: Ht 5\' 7"  (1.702 m)   Wt 280 lb (127 kg)   BMI 43.85 kg/m    GENERAL: alert, oriented, and in no acute distress  NECK: normal movements of the head and neck  LUNGS: on inspection no signs of respiratory distress, breathing rate appears normal, no obvious gross SOB, gasping or wheezing, no conversational dyspnea  CV: no obvious cyanosis  MS: moves all visible extremities without noticeable abnormality  PSYCH/NEURO: pleasant and cooperative, speech and thought processing grossly intact   ASSESSMENT AND PLAN:  1. Generalized anxiety disorder - GAD-7 = 20 Increase: - busPIRone (BUSPAR) 10 MG tablet; Take 1 tablet (10 mg total) by mouth 3 (three) times daily.  Dispense: 270 tablet; Refill: 3 - increased from BID to TID, and advised pt not to take entire days dose in AM but to take 1 10mg  tab TID Increase: - escitalopram (LEXAPRO) 20 MG tablet; Take 1 tablet (20 mg total) by mouth daily.  Dispense: 90 tablet; Refill: 3 - increased from 10mg  to 20mg  - f/u in 2 wks or sooner PRN  2. Moderate major depression (HCC) - PHQ-9 = 13 (improved from previous) - cont wellbutrin Xl 300mg  daily Increase: - escitalopram (LEXAPRO) 20 MG tablet; Take 1 tablet (20 mg total) by mouth daily.  Dispense: 90 tablet; Refill: 3 - increased from 10mg  to 20mg  - f/u in 2 wks or sooner PRN Discussed plan and reviewed medications with patient, including risks, benefits, and potential side effects. Pt expressed understand. All questions answered.    I discussed the assessment and treatment plan with the patient. The patient was provided an opportunity to ask questions and all were answered. The patient agreed with the plan and demonstrated an understanding of the instructions.   The  patient was advised to call back or seek an in-person evaluation if the symptoms worsen or if the condition fails to improve as anticipated.   Letta Median, DO

## 2019-06-22 ENCOUNTER — Ambulatory Visit: Payer: BC Managed Care – PPO | Admitting: Family Medicine

## 2019-07-06 ENCOUNTER — Encounter: Payer: Self-pay | Admitting: Family Medicine

## 2019-07-06 MED ORDER — TRAZODONE HCL 50 MG PO TABS: ORAL_TABLET | ORAL | 0 refills | Status: DC

## 2019-07-19 ENCOUNTER — Other Ambulatory Visit: Payer: Self-pay | Admitting: Surgery

## 2019-07-19 ENCOUNTER — Other Ambulatory Visit (HOSPITAL_COMMUNITY): Payer: Self-pay | Admitting: Surgery

## 2019-08-07 ENCOUNTER — Other Ambulatory Visit (HOSPITAL_COMMUNITY): Payer: BC Managed Care – PPO

## 2019-08-07 ENCOUNTER — Ambulatory Visit (HOSPITAL_COMMUNITY): Payer: BC Managed Care – PPO

## 2019-08-13 ENCOUNTER — Ambulatory Visit (HOSPITAL_COMMUNITY): Payer: BC Managed Care – PPO

## 2019-08-13 ENCOUNTER — Encounter (HOSPITAL_COMMUNITY): Payer: Self-pay

## 2019-08-15 ENCOUNTER — Other Ambulatory Visit: Payer: Self-pay | Admitting: Family Medicine

## 2019-08-15 ENCOUNTER — Ambulatory Visit: Payer: BC Managed Care – PPO | Admitting: Dietician

## 2019-08-15 DIAGNOSIS — F902 Attention-deficit hyperactivity disorder, combined type: Secondary | ICD-10-CM

## 2019-08-15 NOTE — Telephone Encounter (Signed)
Dr. Loletha Grayer Please advise.  Last filled- 12/29  Qty 30 no refills  LOV- 1/6 video  Unable to get into PMP

## 2019-08-16 MED ORDER — AMPHETAMINE-DEXTROAMPHET ER 25 MG PO CP24: 25.0000 mg | ORAL_CAPSULE | ORAL | 0 refills | Status: DC

## 2019-08-22 ENCOUNTER — Ambulatory Visit: Payer: BC Managed Care – PPO | Admitting: Dietician

## 2019-09-30 ENCOUNTER — Other Ambulatory Visit: Payer: Self-pay | Admitting: Family Medicine

## 2019-09-30 DIAGNOSIS — F902 Attention-deficit hyperactivity disorder, combined type: Secondary | ICD-10-CM

## 2019-10-02 MED ORDER — AMPHETAMINE-DEXTROAMPHET ER 25 MG PO CP24: 25.0000 mg | ORAL_CAPSULE | ORAL | 0 refills | Status: DC

## 2019-10-02 NOTE — Telephone Encounter (Signed)
Last VV 06/20/19 Last fill 08/16/19  #30/0

## 2019-10-16 ENCOUNTER — Encounter (HOSPITAL_COMMUNITY): Payer: Self-pay | Admitting: Emergency Medicine

## 2019-10-16 ENCOUNTER — Observation Stay (HOSPITAL_COMMUNITY)
Admission: EM | Admit: 2019-10-16 | Discharge: 2019-10-18 | Disposition: A | Discharge status: Home / Self Care | Payer: BC Managed Care – PPO | Attending: Obstetrics and Gynecology | Admitting: Obstetrics and Gynecology

## 2019-10-16 ENCOUNTER — Emergency Department (HOSPITAL_COMMUNITY): Payer: BC Managed Care – PPO

## 2019-10-16 DIAGNOSIS — F419 Anxiety disorder, unspecified: Secondary | ICD-10-CM | POA: Insufficient documentation

## 2019-10-16 DIAGNOSIS — N838 Other noninflammatory disorders of ovary, fallopian tube and broad ligament: Secondary | ICD-10-CM

## 2019-10-16 DIAGNOSIS — E079 Disorder of thyroid, unspecified: Secondary | ICD-10-CM | POA: Insufficient documentation

## 2019-10-16 DIAGNOSIS — Z20822 Contact with and (suspected) exposure to covid-19: Secondary | ICD-10-CM | POA: Insufficient documentation

## 2019-10-16 DIAGNOSIS — R1032 Left lower quadrant pain: Secondary | ICD-10-CM | POA: Diagnosis present

## 2019-10-16 DIAGNOSIS — D279 Benign neoplasm of unspecified ovary: Secondary | ICD-10-CM | POA: Diagnosis not present

## 2019-10-16 DIAGNOSIS — F329 Major depressive disorder, single episode, unspecified: Secondary | ICD-10-CM | POA: Diagnosis not present

## 2019-10-16 DIAGNOSIS — R102 Pelvic and perineal pain: Secondary | ICD-10-CM | POA: Diagnosis not present

## 2019-10-16 DIAGNOSIS — Z79890 Hormone replacement therapy (postmenopausal): Secondary | ICD-10-CM | POA: Insufficient documentation

## 2019-10-16 DIAGNOSIS — Z79899 Other long term (current) drug therapy: Secondary | ICD-10-CM | POA: Diagnosis not present

## 2019-10-16 DIAGNOSIS — F909 Attention-deficit hyperactivity disorder, unspecified type: Secondary | ICD-10-CM | POA: Diagnosis not present

## 2019-10-16 LAB — URINALYSIS, ROUTINE W REFLEX MICROSCOPIC
Bacteria, UA: NONE SEEN
Bilirubin Urine: NEGATIVE
Glucose, UA: NEGATIVE mg/dL
Ketones, ur: NEGATIVE mg/dL
Leukocytes,Ua: NEGATIVE
Nitrite: NEGATIVE
Protein, ur: NEGATIVE mg/dL
Specific Gravity, Urine: 1.018 (ref 1.005–1.030)
pH: 5 (ref 5.0–8.0)

## 2019-10-16 LAB — CBC
HCT: 41.4 % (ref 36.0–46.0)
Hemoglobin: 13.6 g/dL (ref 12.0–15.0)
MCH: 29.8 pg (ref 26.0–34.0)
MCHC: 32.9 g/dL (ref 30.0–36.0)
MCV: 90.6 fL (ref 80.0–100.0)
Platelets: 275 10*3/uL (ref 150–400)
RBC: 4.57 MIL/uL (ref 3.87–5.11)
RDW: 12.4 % (ref 11.5–15.5)
WBC: 9.3 10*3/uL (ref 4.0–10.5)
nRBC: 0 % (ref 0.0–0.2)

## 2019-10-16 LAB — COMPREHENSIVE METABOLIC PANEL
ALT: 27 U/L (ref 0–44)
AST: 17 U/L (ref 15–41)
Albumin: 3.9 g/dL (ref 3.5–5.0)
Alkaline Phosphatase: 58 U/L (ref 38–126)
Anion gap: 7 (ref 5–15)
BUN: 11 mg/dL (ref 6–20)
CO2: 23 mmol/L (ref 22–32)
Calcium: 9.4 mg/dL (ref 8.9–10.3)
Chloride: 107 mmol/L (ref 98–111)
Creatinine, Ser: 1.04 mg/dL — ABNORMAL HIGH (ref 0.44–1.00)
GFR calc Af Amer: >60 mL/min (ref >60)
GFR calc non Af Amer: >60 mL/min (ref >60)
Glucose, Bld: 100 mg/dL — ABNORMAL HIGH (ref 70–99)
Potassium: 3.9 mmol/L (ref 3.5–5.1)
Sodium: 137 mmol/L (ref 135–145)
Total Bilirubin: 0.7 mg/dL (ref 0.3–1.2)
Total Protein: 6.8 g/dL (ref 6.5–8.1)

## 2019-10-16 LAB — I-STAT BETA HCG BLOOD, ED (MC, WL, AP ONLY): I-stat hCG, quantitative: <5 m[IU]/mL (ref <5)

## 2019-10-16 LAB — LIPASE, BLOOD: Lipase: 32 U/L (ref 11–51)

## 2019-10-16 MED ORDER — SODIUM CHLORIDE 0.9 % IV BOLUS
1000.0000 mL | Freq: Once | INTRAVENOUS | Status: AC
  Administered 2019-10-16: 1000 mL via INTRAVENOUS

## 2019-10-16 MED ORDER — SODIUM CHLORIDE 0.9% FLUSH: 3.0000 mL | Freq: Once | INTRAVENOUS | Status: DC

## 2019-10-16 MED ORDER — HYDROMORPHONE HCL 1 MG/ML IJ SOLN
1.0000 mg | Freq: Once | INTRAMUSCULAR | Status: AC
  Administered 2019-10-16: 1 mg via INTRAVENOUS
  Filled 2019-10-16: qty 1

## 2019-10-16 MED ORDER — KETOROLAC TROMETHAMINE 30 MG/ML IJ SOLN
30.0000 mg | Freq: Once | INTRAMUSCULAR | Status: AC
  Administered 2019-10-16: 30 mg via INTRAVENOUS
  Filled 2019-10-16: qty 1

## 2019-10-16 MED ORDER — MORPHINE SULFATE (PF) 4 MG/ML IV SOLN
4.0000 mg | Freq: Once | INTRAVENOUS | Status: AC
  Administered 2019-10-16: 4 mg via INTRAVENOUS
  Filled 2019-10-16: qty 1

## 2019-10-16 MED ORDER — ONDANSETRON HCL 4 MG/2ML IJ SOLN
4.0000 mg | Freq: Once | INTRAMUSCULAR | Status: AC
  Administered 2019-10-16: 4 mg via INTRAVENOUS
  Filled 2019-10-16: qty 2

## 2019-10-16 NOTE — ED Provider Notes (Signed)
I assumed care of patient at shift change from previous team, please see their note for full H&P.  Briefly patient is here for evaluation of pelvic pain. Physical Exam  BP 135/73   Pulse 67   Temp 98.2 F (36.8 C) (Oral)   Resp 16   Ht 5\' 8"  (1.727 m)   Wt 121.6 kg   SpO2 100%   BMI 40.75 kg/m   Patient is upright in bed, awake and alert.  She is tearful and appears uncomfortable.   If her pain is uncontrolled then call Dr. Ouida Sills  Re-check an hour or so after painmeds D/C home with Oxy  On re-check patient reports that her pain improved slightly with dilaudid, however reports that was short lived and she feels like this is more related to her feeling high and not caring about her pain than he pain being controlled. She is tearful.   0014: I spoke with Dr. Ouida Sills about patients pain being not controlled, he says patient can be admitted to med surg floor under his service.  I will put in temp admit orders for med surg for him.  Covid test ordered.        Lorin Glass, [place] 10/17/19 [id number]    Tegeler, Gwenyth Allegra, MD 10/17/19 [phone]

## 2019-10-16 NOTE — ED Provider Notes (Signed)
[facility] EMERGENCY DEPARTMENT Provider Note   CSN: [id number] Arrival date & time: 10/16/19  1727     History Chief Complaint  Patient presents with  . Ovarian Cyst    Renee Jensen is a 24 y.o. female with past history significant for pseudotumor cerebri, ovarian cyst who presents for evaluation of left lower quadrant pain.  Was seen by OB/GYN on Thursday.  Had ultrasound performed which showed greater than 5 cm likely ovarian cyst with increased blood flow.  Was told that she would need to have her left ovary as well as the mass removed.  Has had increased pain since then.  She is followed by [facility], Dr. Philis Pique.  She called their office earlier today and stated that her physician was not in the office.  Apparently they called Dr. Ouida Sills with [facility] he is aware of patient.  Patient is unsure if she was sent here for just pain control versus repeat imaging versus surgical intervention.  Her current pain a 9/10.  Worse with laying flat and movement.  Denies any concerns for STDs.  Did have 2 episodes of light vaginal bleeding her has not had to wear a pad.  She denies fever, chills, nausea, vomiting, chest pain, shortness of breath, dysuria, constipation or diarrhea.  Denies additional aggravating or relieving factors.  History obtained from patient and past medical records. No interpretor was used.  HPI     Past Medical History:  Diagnosis Date  . ADHD   . Anxiety   . Depression   . Pseudotumor cerebri   . Thyroid disease     Patient Active Problem List   Diagnosis Date Noted  . Missed abortion 10/25/2015    Past Surgical History:  Procedure Laterality Date  . ADENOIDECTOMY    . CHOLECYSTECTOMY    . DILATION AND EVACUATION N/A 06/27/2015   Procedure: DILATATION AND EVACUATION;  Surgeon: Bobbye Charleston, MD;  Location: [facility];  Service: Gynecology;  Laterality: N/A;  . DILATION AND EVACUATION N/A 10/25/2015   Procedure:  DILATATION AND EVACUATION;  Surgeon: Bobbye Charleston, MD;  Location: [facility];  Service: Gynecology;  Laterality: N/A;  . TYMPANOSTOMY TUBE PLACEMENT       OB History    Gravida  2   Para      Term      Preterm      AB  1   Living        SAB  1   TAB      Ectopic      Multiple      Live Births              Family History  Problem Relation Age of Onset  . Ulcerative colitis Mother   . Rheum arthritis Mother   . Fibromyalgia Mother   . Hypothyroidism Mother   . Kidney disease Mother   . Diabetes Father   . Hypertension Father   . Heart disease Father   . Hyperlipidemia Father     Social History   Tobacco Use  . Smoking status: Never Smoker  . Smokeless tobacco: Never Used  Substance Use Topics  . Alcohol use: Yes  . Drug use: No    Home Medications Prior to Admission medications   Medication Sig Start Date End Date Taking? Authorizing Provider  amphetamine-dextroamphetamine (ADDERALL XR) 25 MG 24 hr capsule Take 1 capsule by mouth every morning. 10/02/19  Yes Cirigliano, Garvin Fila, DO  buPROPion [facility]  XL) 300 MG 24 hr tablet Take 1 tablet (300 mg total) by mouth daily. 12/08/18  Yes Cirigliano, Mary K, DO  busPIRone (BUSPAR) 10 MG tablet Take 1 tablet (10 mg total) by mouth 3 (three) times daily. 06/20/19  Yes Cirigliano, Mary K, DO  escitalopram (LEXAPRO) 20 MG tablet Take 1 tablet (20 mg total) by mouth daily. 06/20/19  Yes Cirigliano, Mary K, DO  levothyroxine (SYNTHROID, LEVOTHROID) 112 MCG tablet Take 112 mcg by mouth daily before breakfast.   Yes [provider]  traZODone (DESYREL) 50 MG tablet 1/2 to 1 tab po qHS PRN Patient taking differently: Take 50 mg by mouth at bedtime as needed for sleep.  07/06/19  Yes Cirigliano, Mary K, DO    Allergies    Diazepam and Alprazolam  Review of Systems   Review of Systems  Constitutional: Negative.   HENT: Negative.   Respiratory: Negative.   Cardiovascular: Negative.   Gastrointestinal:  Positive for abdominal pain. Negative for abdominal distention, anal bleeding, blood in stool, constipation, diarrhea, nausea, rectal pain and vomiting.  Genitourinary: Negative for decreased urine volume, difficulty urinating, dysuria, flank pain, hematuria, menstrual problem, pelvic pain, urgency, vaginal bleeding, vaginal discharge and vaginal pain.  Musculoskeletal: Negative.   Skin: Negative.   Neurological: Negative.   All other systems reviewed and are negative.   Physical Exam Updated Vital Signs BP 135/73   Pulse 67   Temp 98.2 F (36.8 C) (Oral)   Resp 16   Ht 5\' 8"  (1.727 m)   Wt 121.6 kg   SpO2 100%   BMI 40.75 kg/m   Physical Exam Vitals and nursing note reviewed.  Constitutional:      General: She is not in acute distress.    Appearance: She is well-developed. She is obese. She is not ill-appearing, toxic-appearing or diaphoretic.  HENT:     Head: Normocephalic and atraumatic.     Nose: Nose normal.     Mouth/Throat:     Mouth: Mucous membranes are moist.     Pharynx: Oropharynx is clear.  Eyes:     Pupils: Pupils are equal, round, and reactive to light.  Cardiovascular:     Rate and Rhythm: Normal rate.     Pulses: Normal pulses.     Heart sounds: Normal heart sounds.  Pulmonary:     Effort: Pulmonary effort is normal. No respiratory distress.     Breath sounds: Normal breath sounds.  Abdominal:     General: Bowel sounds are normal. There is no distension.     Palpations: Abdomen is soft.     Tenderness: There is abdominal tenderness. There is no right CVA tenderness, left CVA tenderness or guarding. Negative signs include Murphy's sign and McBurney's sign.     Hernia: No hernia is present.    Musculoskeletal:        General: Normal range of motion.     Cervical back: Normal range of motion.  Skin:    General: Skin is warm and dry.     Capillary Refill: Capillary refill takes less than 2 seconds.     Comments: Brisk cap refill  Neurological:      Mental Status: She is alert.     ED Results / Procedures / Treatments   Labs (all labs ordered are listed, but only abnormal results are displayed) Labs Reviewed  COMPREHENSIVE METABOLIC PANEL - Abnormal; Notable for the following components:      Result Value   Glucose, Bld 100 (*)  Creatinine, Ser 1.04 (*)    All other components within normal limits  URINALYSIS, ROUTINE W REFLEX MICROSCOPIC - Abnormal; Notable for the following components:   Hgb urine dipstick LARGE (*)    All other components within normal limits  LIPASE, BLOOD  CBC  I-STAT BETA HCG BLOOD, ED ([facility], [facility], [facility] ONLY)    EKG None  Radiology US Transvaginal Non-OB  Result Date: 10/16/2019 CLINICAL DATA:  Initial evaluation for acute left lower quadrant pain, known cyst. EXAM: TRANSABDOMINAL AND TRANSVAGINAL ULTRASOUND OF PELVIS DOPPLER ULTRASOUND OF OVARIES TECHNIQUE: Both transabdominal and transvaginal ultrasound examinations of the pelvis were performed. Transabdominal technique was performed for global imaging of the pelvis including uterus, ovaries, adnexal regions, and pelvic cul-de-sac. It was necessary to proceed with endovaginal exam following the transabdominal exam to visualize the uterus, endometrium, and ovaries. Color and duplex Doppler ultrasound was utilized to evaluate blood flow to the ovaries. COMPARISON:  Prior ultrasound from 10/25/2015. FINDINGS: Uterus Measurements: 5.5 x 2.9 x 3.8 cm = volume: 31.4 mL. No fibroids or other mass visualized. Endometrium Thickness: 6 mm. No focal abnormality visualized. IUD in appropriate position within the endometrial cavity. Right ovary Measurements: 3.1 x 2.0 x 2.3 cm = volume: 7.6 mL. Normal appearance/no adnexal mass. Left ovary Measurements: 7.4 x 3.1 x 7.3 cm = volume: 86.6 mL. Large solid-appearing mass involving the left ovary measures 5.3 x 3.4 x 5.4 cm. Lesion demonstrates a heterogeneous echotexture with internal vascularity. Pulsed Doppler evaluation of both  ovaries demonstrates normal low-resistance arterial and venous waveforms. Other findings Small volume free fluid present within the pelvis. IMPRESSION: 1. 5.3 x 3.4 x 5.4 cm solid left adnexal mass, suspected to be ovarian in origin. Finding is nonspecific, but concerning for possible ovarian neoplasm. Gynecologic referral for further workup and surgical consultation recommended if not already performed. 2. No evidence for ovarian torsion. 3. Small volume free fluid within the pelvis. No other acute abnormality identified. Electronically Signed   By: Jeannine Boga M.D.   On: 10/16/2019 22:04   US Pelvis Complete  Result Date: 10/16/2019 CLINICAL DATA:  Initial evaluation for acute left lower quadrant pain, known cyst. EXAM: TRANSABDOMINAL AND TRANSVAGINAL ULTRASOUND OF PELVIS DOPPLER ULTRASOUND OF OVARIES TECHNIQUE: Both transabdominal and transvaginal ultrasound examinations of the pelvis were performed. Transabdominal technique was performed for global imaging of the pelvis including uterus, ovaries, adnexal regions, and pelvic cul-de-sac. It was necessary to proceed with endovaginal exam following the transabdominal exam to visualize the uterus, endometrium, and ovaries. Color and duplex Doppler ultrasound was utilized to evaluate blood flow to the ovaries. COMPARISON:  Prior ultrasound from 10/25/2015. FINDINGS: Uterus Measurements: 5.5 x 2.9 x 3.8 cm = volume: 31.4 mL. No fibroids or other mass visualized. Endometrium Thickness: 6 mm. No focal abnormality visualized. IUD in appropriate position within the endometrial cavity. Right ovary Measurements: 3.1 x 2.0 x 2.3 cm = volume: 7.6 mL. Normal appearance/no adnexal mass. Left ovary Measurements: 7.4 x 3.1 x 7.3 cm = volume: 86.6 mL. Large solid-appearing mass involving the left ovary measures 5.3 x 3.4 x 5.4 cm. Lesion demonstrates a heterogeneous echotexture with internal vascularity. Pulsed Doppler evaluation of both ovaries demonstrates normal  low-resistance arterial and venous waveforms. Other findings Small volume free fluid present within the pelvis. IMPRESSION: 1. 5.3 x 3.4 x 5.4 cm solid left adnexal mass, suspected to be ovarian in origin. Finding is nonspecific, but concerning for possible ovarian neoplasm. Gynecologic referral for further workup and surgical consultation recommended if not already  performed. 2. No evidence for ovarian torsion. 3. Small volume free fluid within the pelvis. No other acute abnormality identified. Electronically Signed   By: Jeannine Boga M.D.   On: 10/16/2019 22:04   [place] Art/Ven Flow Abd Pelv Doppler  Result Date: 10/16/2019 CLINICAL DATA:  Initial evaluation for acute left lower quadrant pain, known cyst. EXAM: TRANSABDOMINAL AND TRANSVAGINAL ULTRASOUND OF PELVIS DOPPLER ULTRASOUND OF OVARIES TECHNIQUE: Both transabdominal and transvaginal ultrasound examinations of the pelvis were performed. Transabdominal technique was performed for global imaging of the pelvis including uterus, ovaries, adnexal regions, and pelvic cul-de-sac. It was necessary to proceed with endovaginal exam following the transabdominal exam to visualize the uterus, endometrium, and ovaries. Color and duplex Doppler ultrasound was utilized to evaluate blood flow to the ovaries. COMPARISON:  Prior ultrasound from 10/25/2015. FINDINGS: Uterus Measurements: 5.5 x 2.9 x 3.8 cm = volume: 31.4 mL. No fibroids or other mass visualized. Endometrium Thickness: 6 mm. No focal abnormality visualized. IUD in appropriate position within the endometrial cavity. Right ovary Measurements: 3.1 x 2.0 x 2.3 cm = volume: 7.6 mL. Normal appearance/no adnexal mass. Left ovary Measurements: 7.4 x 3.1 x 7.3 cm = volume: 86.6 mL. Large solid-appearing mass involving the left ovary measures 5.3 x 3.4 x 5.4 cm. Lesion demonstrates a heterogeneous echotexture with internal vascularity. Pulsed Doppler evaluation of both ovaries demonstrates normal low-resistance  arterial and venous waveforms. Other findings Small volume free fluid present within the pelvis. IMPRESSION: 1. 5.3 x 3.4 x 5.4 cm solid left adnexal mass, suspected to be ovarian in origin. Finding is nonspecific, but concerning for possible ovarian neoplasm. Gynecologic referral for further workup and surgical consultation recommended if not already performed. 2. No evidence for ovarian torsion. 3. Small volume free fluid within the pelvis. No other acute abnormality identified. Electronically Signed   By: Jeannine Boga M.D.   On: 10/16/2019 22:04    Procedures Procedures (including critical care time)  Medications Ordered in ED Medications  sodium chloride flush (NS) 0.9 % injection 3 mL (has no administration in time range)  ketorolac (TORADOL) 30 MG/ML injection 30 mg (has no administration in time range)  HYDROmorphone (DILAUDID) injection 1 mg (has no administration in time range)  sodium chloride 0.9 % bolus 1,000 mL (1,000 mLs Intravenous New Bag/Given 10/16/19 1939)  ondansetron (ZOFRAN) injection 4 mg (4 mg Intravenous Given 10/16/19 1928)  morphine 4 MG/ML injection 4 mg (4 mg Intravenous Given 10/16/19 1928)   ED Course  I have reviewed the triage vital signs and the nursing notes.  Pertinent labs & imaging results that were available during my care of the patient were reviewed by me and considered in my medical decision making (see chart for details).  24 year old female presents for evaluation of left-sided pelvic pain.  Recently diagnosed at [facility] with greater than 5 cm ovarian cyst.  Apparently had imaging done last Thursday and was told she needs to have her ovary as well as this mass removed as it had increased blood flow and there is concerned that this could possibly be cancerous in nature.  Has had increased pain since then.  Denies any concerns for STDs.  Heart and lungs clear.  Abdomen tenderness to left lower quadrant without rebound or guarding.  Patient  states she called OB/GYN and was told that her OB/GYN was not in the office and to proceed to the emergency department where Dr. Ouida Sills who is on-call is aware of patient.  She is not sure she  was sent here for pain management or surgical intervention.  She had STD testing does not want additional testing here in ED or pelvic exam as she had this performed a few days ago.  CONSULT with Dr. Ouida Sills with [facility]. Recommends [place] to r/o and pain controlled. States if pain controlled can dc home and follow up in office.  Labs and imaging personally reviewed and interpreted: CBC without leukocytosis, hemoglobin [id number] Metabolic panel with mildly elevated glucose at 100, creatinine 1.04 however no additional electrolyte, renal abnormality Pregnancy test negative Lipase 32 UA negative for infection. [place] left ovarian mass. No ovarian torsion  Patient reassessed. Pain was initially improved after Morphine however quickly returned. Will give additional dose of pain meds here in ED.  Care transferred to [facility] will plan to follow-up.  If pain controlled in ED plan to DC home with oxycodone.  If pain uncontrolled would likely consult Dr. Ouida Sills again here previously spoke with.    MDM Rules/Calculators/A&P                       Final Clinical Impression(s) / ED Diagnoses Final diagnoses:  Ovarian mass, left    Rx / DC Orders ED Discharge Orders    None       Merwyn Hodapp A, PA-C 10/16/19 2213    Little, Wenda Overland, MD 10/16/19 2214

## 2019-10-16 NOTE — ED Triage Notes (Signed)
Pt found out she had a over 5cm ovarian cyst on her left ovary and was told she would most likely need to have this ovary removed. However her doctor is out of town and was told to come here for pain management and possible referral to on call ob.

## 2019-10-17 ENCOUNTER — Observation Stay (HOSPITAL_COMMUNITY): Payer: BC Managed Care – PPO

## 2019-10-17 DIAGNOSIS — R102 Pelvic and perineal pain: Secondary | ICD-10-CM | POA: Diagnosis present

## 2019-10-17 LAB — RESPIRATORY PANEL BY RT PCR (FLU A&B, COVID)
Influenza A by PCR: NEGATIVE
Influenza B by PCR: NEGATIVE
SARS Coronavirus 2 by RT PCR: NEGATIVE

## 2019-10-17 LAB — SURGICAL PCR SCREEN
MRSA, PCR: NEGATIVE
Staphylococcus aureus: NEGATIVE

## 2019-10-17 MED ORDER — ONDANSETRON HCL 4 MG/2ML IJ SOLN: 4.0000 mg | Freq: Three times a day (TID) | INTRAMUSCULAR | Status: AC | PRN

## 2019-10-17 MED ORDER — ONDANSETRON HCL 4 MG PO TABS
8.0000 mg | ORAL_TABLET | Freq: Three times a day (TID) | ORAL | Status: DC | PRN
  Administered 2019-10-17 – 2019-10-18 (×2): 8 mg via ORAL
  Filled 2019-10-17 (×2): qty 2

## 2019-10-17 MED ORDER — PROMETHAZINE HCL 25 MG PO TABS
12.5000 mg | ORAL_TABLET | Freq: Four times a day (QID) | ORAL | Status: DC | PRN
  Administered 2019-10-17: 12.5 mg via ORAL
  Filled 2019-10-17: qty 1

## 2019-10-17 MED ORDER — GADOBUTROL 1 MMOL/ML IV SOLN
10.0000 mL | Freq: Once | INTRAVENOUS | Status: AC | PRN
  Administered 2019-10-17: 10 mL via INTRAVENOUS

## 2019-10-17 MED ORDER — SODIUM CHLORIDE 0.9 % IV SOLN: INTRAVENOUS | Status: AC

## 2019-10-17 MED ORDER — HYDROMORPHONE HCL 1 MG/ML IJ SOLN
0.5000 mg | INTRAMUSCULAR | Status: DC | PRN
  Administered 2019-10-17 (×2): 0.5 mg via INTRAVENOUS
  Filled 2019-10-17 (×2): qty 1

## 2019-10-17 MED ORDER — HYDROMORPHONE HCL 2 MG PO TABS
1.0000 mg | ORAL_TABLET | ORAL | Status: DC | PRN
  Administered 2019-10-17 – 2019-10-18 (×3): 1 mg via ORAL
  Filled 2019-10-17 (×3): qty 1

## 2019-10-17 NOTE — Progress Notes (Signed)
HD#2  Pt states pain is improved with dilaudid IV where prior tylenol, ibuprofen did not help.  She reports continued LLQ discomfort, but tolerable. On exam: no rebound, no guarding, no peritoneal signs, pt sitting comfortable not displaying pain, casually conversant, no calf tenderness A/P: 5cm complex ovarian mass hospitalized for pain management Will switch from IV dilaudid to po dilaudid, if pt tolerated and pain is well controlled will have pt f/u as scheduled in office with her physician Dr. Philis Pique. Tumor marks drawn in office came back negative. Given the US findings of solidity, will need gyn on input regarding ongoing surveillance vs surgery.

## 2019-10-17 NOTE — Plan of Care (Signed)

## 2019-10-17 NOTE — Progress Notes (Signed)
Pt. arrived to 6N25 at this time. Oriented to room and to call bell. Admitting provider paged about pt. arrival to unit. Pt. complaining of pain will administer PRN medication. Will continue to monitor.

## 2019-10-17 NOTE — Progress Notes (Signed)
Pt has multiple piercing's that she was unable to remove. The Radiologist was called and he gave the consent to continue exam but if the pt were to feel any discomfort or heating to end the exam. Pt did early into exam think she felt her belly button piercing warm up so she stopped the exam. We let Miss Blacketer know if she is able to remove piercing we would be glad to try again.

## 2019-10-17 NOTE — H&P (Signed)
Renee Jensen is an 24 y.o. G23P0020 white female pt of Dr. Philis Pique who contacted the office yesterday stating that her LAP had exacerbated and was now not controlled with NSAIDs.  Pt initially contacted the office on 09/18/19 stating that she had developed a sharp LLQ pain. She was told that it was likely an ovarian cyst and to use advil for the pain. She then contacted the office on 09/28/19 and stated that she continued to have pain. She was set up for an office visit and an u/s. She was seen on 10/11/19. She had an ultrasound which indicated that she had a 5cm complex left ovarian mass. There was no evidence of torsion.  Because the blood flow to the cyst was atypical for a hemorrhagic cyst the pt had tumor markers obtained. The BHCG/CA125/CEA/CA19-9 were all normal. She has a follow up appt scheduled with Dr. Philis Pique on 10/25/19. After contacting the office the pt proceeded to the ER . She had an u/s which again indicated that the pt had a 5 cm x 5cm solid mass in left ovary. There was no evidence of torsion. Pt had a nl CBC, nl appetite. She was given IV dilaudid and morphine. The pain did not resolve and she was admitted for observation.   The pt has not had any pain meds since 0230.    Past Medical History:  Diagnosis Date  . ADHD   . Anxiety   . Depression   . Pseudotumor cerebri   . Thyroid disease     Past Surgical History:  Procedure Laterality Date  . ADENOIDECTOMY    . CHOLECYSTECTOMY    . DILATION AND EVACUATION N/A 06/27/2015   Procedure: DILATATION AND EVACUATION;  Surgeon: Bobbye Charleston, MD;  Location: [facility];  Service: Gynecology;  Laterality: N/A;  . DILATION AND EVACUATION N/A 10/25/2015   Procedure: DILATATION AND EVACUATION;  Surgeon: Bobbye Charleston, MD;  Location: [facility];  Service: Gynecology;  Laterality: N/A;  . TYMPANOSTOMY TUBE PLACEMENT      Family History  Problem Relation Age of Onset  . Ulcerative colitis Mother   . Rheum arthritis Mother   . Fibromyalgia  Mother   . Hypothyroidism Mother   . Kidney disease Mother   . Diabetes Father   . Hypertension Father   . Heart disease Father   . Hyperlipidemia Father    Social History:  reports that she has never smoked. She has never used smokeless tobacco. She reports current alcohol use. She reports that she does not use drugs.  Allergies:  Allergies  Allergen Reactions  . Diazepam Anxiety, Other (See Comments) and Shortness Of Breath    Severe anxiety Severe anxiety   . Alprazolam Anxiety and Other (See Comments)    Has opposite effect Hysterical crying, unable to walk and talk Hysterical crying, unable to walk and talk     Medications Prior to Admission  Medication Sig Dispense Refill  . amphetamine-dextroamphetamine (ADDERALL XR) 25 MG 24 hr capsule Take 1 capsule by mouth every morning. 30 capsule 0  . buPROPion (WELLBUTRIN XL) 300 MG 24 hr tablet Take 1 tablet (300 mg total) by mouth daily. 90 tablet 3  . busPIRone (BUSPAR) 10 MG tablet Take 1 tablet (10 mg total) by mouth 3 (three) times daily. 270 tablet 3  . escitalopram (LEXAPRO) 20 MG tablet Take 1 tablet (20 mg total) by mouth daily. 90 tablet 3  . levothyroxine (SYNTHROID, LEVOTHROID) 112 MCG tablet Take 112 mcg by mouth daily before breakfast.    .  traZODone (DESYREL) 50 MG tablet 1/2 to 1 tab po qHS PRN (Patient taking differently: Take 50 mg by mouth at bedtime as needed for sleep. ) 30 tablet 0       Blood pressure 113/63, pulse 74, temperature 97.7 F (36.5 C), temperature source Oral, resp. rate 16, height 5\' 8"  (1.727 m), weight 121.6 kg, SpO2 100 %, unknown if currently breastfeeding. General appearance: alert and cooperative Abdomen: soft, non tender, no rebound or guarding. No palp mass   Lab Results  Component Value Date   WBC 9.3 10/16/2019   HGB 13.6 10/16/2019   HCT 41.4 10/16/2019   MCV 90.6 10/16/2019   PLT 275 10/16/2019   Lab Results  Component Value Date   HCG <5.0 10/16/2019       Patient Active Problem List   Diagnosis Date Noted  . Pelvic pain 10/17/2019  . Missed abortion 10/25/2015   IMP/ Left ovarian solid mass- Given the pt's age and use of an IUD the most likely diagnosis is a hemorrhagic cyst. She does have some free fluid in her pelvis but the solid mass in the ovary does not give a classic image of a hemorrhagic cyst.  Her tumor markers are normal.There is no torsion, A 5 cm solid tumor does not typically cause pain without torsion.   With the concern for a neoplasm per radiology we will need to consult with Gyn Onc prior to any surgical procedure. At this time she does not have an acute abd and the pain has improved  Renee Jensen E 10/17/2019, 7:52 AM

## 2019-10-17 NOTE — Plan of Care (Signed)

## 2019-10-18 MED ORDER — PROMETHAZINE HCL 12.5 MG PO TABS: 12.5000 mg | ORAL_TABLET | Freq: Four times a day (QID) | ORAL | 0 refills | Status: DC | PRN

## 2019-10-18 MED ORDER — HYDROMORPHONE HCL 2 MG PO TABS: 1.0000 mg | ORAL_TABLET | ORAL | 0 refills | Status: DC | PRN

## 2019-10-18 NOTE — Progress Notes (Signed)
Patient seen and examined.  Doing well.  Eating breakfast without nausea or vomiting.  Last dose of dilaudid was at 2040 yesterday evening.  Pain worse with movement, but much improved from admission.  Dr Rogue Bussing discussed imaging with Dr. Denman George yesterday.  She recommended an MRI, which was obtained.    MRI:   Arising from the surface of the left ovary medially and posteriorly is a 5.7 x 5.5 x 4.4 cm solid mass. This has slight increased T1 signal intensity compared to the myometrium and very low T2 signal intensity. It appears to have some septations. Heterogeneous contrast enhancement is demonstrated. 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  BP (!) 127/59 (BP Location: Left Arm)   Pulse (!) 57   Temp 98.2 F (36.8 C) (Oral)   Resp 17   Ht 5\' 8"  (1.727 m)   Wt 121.6 kg   SpO2 100%   BMI 40.75 kg/m   NAD Abdomen: soft, non-distended.  Mild TTP LLQ but no rebound or guarding.     A/P:  Left ovarian mass, most consistent with fibrothecoma on MRI Pain controlled with PO pain meds OK for discharge to home with outpatient follow up with Dr. Philis Pique next week for discussion of surgical planning

## 2019-10-18 NOTE — Discharge Summary (Signed)
Physician Discharge Summary  Patient ID: Renee Jensen MRN: [id number] DOB/AGE: [birth year] 24 y.o.  Admit date: 10/16/2019 Discharge date: 10/18/2019  Admission Diagnoses:  Pelvic Pain  Discharge Diagnoses:  Active Problems:   Pelvic pain   Discharged Condition: stable  Hospital Course: 24 yo G2P0020 with known left ovarian mass admitted for pelvic pain.  She presented to the ED with worsening lower abdominal pain.  US showed a known 5 cm solid mass, no evidence of torsion.  Pain was not controlled so she was admitted for observation.  Her abdominal exam did not reveal a surgical abdomen.  Her pain was initially controlled with IV pain meds. On HD#1 she was transitioned to PO pain medication.  She received an MRI while inpatient per gyn onc recommendation to further characterize the mass.  It was consistent with an ovarian fibrothecoma. On HD#2, she was tolerating PO without nausea and vomiting (both food an pain meds).  Pain was well controlled and she was deemed stable for discharge to home  Consults: None  Significant Diagnostic Studies: radiology: MRI: ovarian fibrothecoma as above, left ovary  Treatments: analgesia: Dilaudid  Discharge Exam: Blood pressure (!) 127/59, pulse (!) 57, temperature 98.2 F (36.8 C), temperature source Oral, resp. rate 17, height 5\' 8"  (1.727 m), weight 121.6 kg, SpO2 100 %, unknown if currently breastfeeding. General appearance: alert, cooperative, appears stated age and no distress GI: normal findings: soft, non-distended, no rebound or guarding.  Mild TTP LLQ with deep palpation  Disposition: Discharge disposition: 01-Home or Self Care       Discharge Instructions    Call MD for:  persistant nausea and vomiting   Complete by: As directed    Call MD for:  severe uncontrolled pain   Complete by: As directed    Diet general   Complete by: As directed    Increase activity slowly   Complete by: As directed      Allergies as of 10/18/2019    Reactions   Diazepam Anxiety, Other (See Comments), Shortness Of Breath   Severe anxiety Severe anxiety   Alprazolam Anxiety, Other (See Comments)   Has opposite effect Hysterical crying, unable to walk and talk Hysterical crying, unable to walk and talk      Medication List    TAKE these medications   amphetamine-dextroamphetamine 25 MG 24 hr capsule Commonly known as: Adderall XR Take 1 capsule by mouth every morning.   buPROPion 300 MG 24 hr tablet Commonly known as: WELLBUTRIN XL Take 1 tablet (300 mg total) by mouth daily.   busPIRone 10 MG tablet Commonly known as: BUSPAR Take 1 tablet (10 mg total) by mouth 3 (three) times daily.   escitalopram 20 MG tablet Commonly known as: Lexapro Take 1 tablet (20 mg total) by mouth daily.   HYDROmorphone 2 MG tablet Commonly known as: DILAUDID Take 0.5 tablets (1 mg total) by mouth every 4 (four) hours as needed for severe pain.   levothyroxine 112 MCG tablet Commonly known as: SYNTHROID Take 112 mcg by mouth daily before breakfast.   promethazine 12.5 MG tablet Commonly known as: PHENERGAN Take 1 tablet (12.5 mg total) by mouth every 6 (six) hours as needed for nausea or vomiting.   traZODone 50 MG tablet Commonly known as: DESYREL 1/2 to 1 tab po qHS PRN What changed:   how much to take  how to take this  when to take this  reasons to take this  additional instructions      Follow-up  Information    Bobbye Charleston, MD Follow up on 10/25/2019.   Specialty: Obstetrics and Gynecology Why: 4:30 PM Contact information: [address] [place] [address] [phone]           Signed: [place] 10/18/2019, 8:49 AM

## 2019-10-18 NOTE — Progress Notes (Signed)
Renee Jensen to be D/C'd per MD order. Discussed with the patient and all questions fully answered. ? VSS, Skin clean, dry and intact without evidence of skin break down, no evidence of skin tears noted. ? IV catheter discontinued intact. Site without signs and symptoms of complications. Dressing and pressure applied. ? An After Visit Summary was printed and given to the patient. Patient informed where to pickup prescriptions. ? D/c education completed with patient/family including follow up instructions, medication list, d/c activities limitations if indicated, with other d/c instructions as indicated by MD - patient able to verbalize understanding, all questions fully answered.  ? Patient instructed to return to ED, call 911, or call MD for any changes in condition.  ? Patient to be escorted via [place], and D/C home via private auto.

## 2019-11-06 ENCOUNTER — Encounter (HOSPITAL_BASED_OUTPATIENT_CLINIC_OR_DEPARTMENT_OTHER): Payer: Self-pay | Admitting: Obstetrics and Gynecology

## 2019-11-06 ENCOUNTER — Other Ambulatory Visit: Payer: Self-pay

## 2019-11-06 NOTE — Progress Notes (Signed)
Spoke w/ via phone for pre-op interview---patient Lab needs dos----urine poct             COVID test ------11-13-2019@830  am Arrive at -------[id number] am 11-14-2019 NPO after ------midnight Medications to take morning of surgery -----buspirone, escitalopram, levothyroxine Diabetic medication -----n/a Patient Special Instructions -----patient given overnight stay instructions including bring all prescription medications in original containers, patient to call dr Philis Pique office and see if spending night after surgery Pre-Op special Istructions -----none Patient verbalized understanding of instructions that were given at this phone interview. Patient denies shortness of breath, chest pain, fever, cough a this phone interview.

## 2019-11-09 ENCOUNTER — Other Ambulatory Visit: Payer: Self-pay | Admitting: Obstetrics and Gynecology

## 2019-11-12 NOTE — H&P (Signed)
24 y.o. G2P0010 with persistent RLQ pain and ovarian mass that may be a thecofibroma.  Pt presented with RLQ pain and an [place] was done-uterus 7x4x4; EM 6.60mm; RO normal. LO 5 cm complex cyst with fluid and solid components. Increased blood flow to cyst. Not c/w hemorrhagic cyst, not typical of dermoid. No ff. IUD in place.  Ovarian mass with solid components, increased blood flow and not consistent with hemorrhagic cyst or dermoid. D/w pt that her age makes it very unlikely tha she has cancer but that the solid components and the increased blood flow make it necessary for [place] to do further investigation.   Tumor markers were drawn and all were normal.   Pt presented to ER with increased pain and Gyn Onc was asked for advice.  Onc suggested an MRI to see about charcterization of mass.  MRI showed: The right ovary is normal. Arising from the surface of the leftovary medially and posteriorly is a 5.7 x 5.5 x 4.4 cm solid mass. This has slight increased T1 signal intensity compared to the myometrium and very low T2 signal intensity. It appears to have some septations. Heterogeneous contrast enhancement is demonstrated. 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 Other:  Small amount of free pelvic fluid is noted. 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  US showed normal blood flow to mass.    Past Medical History:  Diagnosis Date  . ADHD   . Anxiety   . Complication of anesthesia    "wakes up crying"  . Depression   . Hypothyroidism   . Ovarian cyst   . PONV (postoperative nausea and vomiting)    with some surgeries  . Pseudotumor cerebri    no recent symptoms, [facility] [place]  . Thyroid disease    Past Surgical History:  Procedure Laterality Date  . ADENOIDECTOMY  1998  . DILATION AND EVACUATION N/A 06/27/2015   Procedure: DILATATION  AND EVACUATION;  Surgeon: Bobbye Charleston, MD;  Location: [facility];  Service: Gynecology;  Laterality: N/A;  . DILATION AND EVACUATION N/A 10/25/2015   Procedure: DILATATION AND EVACUATION;  Surgeon: Bobbye Charleston, MD;  Location: [facility];  Service: Gynecology;  Laterality: N/A;  . LAPAROSCOPIC CHOLECYSTECTOMY  04/2014  . TONSILLECTOMY  2018  . TYMPANOSTOMY TUBE PLACEMENT  as child 71  . [place] teeth extraction  2021    Social History   Socioeconomic History  . Marital status: Single    Spouse name: Not on file  . Number of children: Not on file  . Years of education: Not on file  . Highest education level: Not on file  Occupational History  . Not on file  Tobacco Use  . Smoking status: Never Smoker  . Smokeless tobacco: Never Used  Substance and Sexual Activity  . Alcohol use: Not Currently  . Drug use: No  . Sexual activity: Yes  Other Topics Concern  . Not on file  Social History Narrative  . Not on file   Social Determinants of Health   Financial Resource Strain:   . Difficulty of Paying Living Expenses:   Food Insecurity:   . Worried About Charity fundraiser in the Last Year:   . Arboriculturist in the Last Year:   Transportation Needs:   . Film/video editor (Medical):   Marland Kitchen Lack of  Transportation (Non-Medical):   Physical Activity:   . Days of Exercise per Week:   . Minutes of Exercise per Session:   Stress:   . Feeling of Stress :   Social Connections:   . Frequency of Communication with Friends and Family:   . Frequency of Social Gatherings with Friends and Family:   . Attends Religious Services:   . Active Member of Clubs or Organizations:   . Attends Archivist Meetings:   Marland Kitchen Marital Status:   Intimate Partner Violence:   . Fear of Current or Ex-Partner:   . Emotionally Abused:   Marland Kitchen Physically Abused:   . Sexually Abused:     No current facility-administered medications on file prior to encounter.   Current Outpatient Medications on  File Prior to Encounter  Medication Sig Dispense Refill  . levonorgestrel (KYLEENA) 19.5 MG IUD by Intrauterine route once. Feb 2018 or 2019    . amphetamine-dextroamphetamine (ADDERALL XR) 25 MG 24 hr capsule Take 1 capsule by mouth every morning. 30 capsule 0  . buPROPion (WELLBUTRIN XL) 300 MG 24 hr tablet Take 1 tablet (300 mg total) by mouth daily. 90 tablet 3  . busPIRone (BUSPAR) 10 MG tablet Take 1 tablet (10 mg total) by mouth 3 (three) times daily. 270 tablet 3  . escitalopram (LEXAPRO) 20 MG tablet Take 1 tablet (20 mg total) by mouth daily. 90 tablet 3  . HYDROmorphone (DILAUDID) 2 MG tablet Take 0.5 tablets (1 mg total) by mouth every 4 (four) hours as needed for severe pain. 15 tablet 0  . levothyroxine (SYNTHROID, LEVOTHROID) 112 MCG tablet Take 112 mcg by mouth daily before breakfast.    . promethazine (PHENERGAN) 12.5 MG tablet Take 1 tablet (12.5 mg total) by mouth every 6 (six) hours as needed for nausea or vomiting. 30 tablet 0  . traZODone (DESYREL) 50 MG tablet 1/2 to 1 tab po qHS PRN (Patient taking differently: Take 50 mg by mouth at bedtime as needed for sleep. ) 30 tablet 0    Allergies  Allergen Reactions  . Diazepam Shortness Of Breath, Anxiety and Other (See Comments)    Severe anxiety with all benzos Severe anxiety   . Alprazolam Anxiety and Other (See Comments)    Has opposite effect with xanax Hysterical crying, unable to walk and talk      Vitals:   10/31/19 1811 11/06/19 1306  Weight: 122.5 kg 122.5 kg  Height: 5\' 8"  (1.727 m) 5\' 8"  (1.727 m)    Lungs: clear to ascultation Cor:  RRR Abdomen:  soft, nontender, nondistended. Ex:  no cords, erythema Pelvic:   Vulva: no masses, no atrophy, no lesions Vagina: no tenderness, no erythema, no abnormal vaginal discharge, no vesicle(s) or ulcers, no cystocele, no rectocele Cervix: grossly normal, no discharge, no cervical motion tenderness Uterus: normal size (7, strings in place), normal shape,  midline, no uterine prolapse, non-tender Bladder/Urethra: normal meatus, no urethral discharge, no urethral mass, bladder non distended, Urethra well supported Adnexa/Parametria: no parametrial tenderness, no parametrial mass, no adnexal tenderness, no ovarian mass felt on original exam   A:  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 Pt desires very much to retain as much fertility as possible for future. Robotic surgery would help if dissection of tumor off ovarian tissue to preserve as much tissue as possible.   P: All risks, benefits and alternatives d/w patient and she desires to proceed.  Patient has undergone a modified  diet, ERAS protocol and will receive preop antibiotics and SCDs during the operation.   Pt to have extended recovery but will go home same day if eating, ambulating, voiding and pain control is good.  Daria Pastures

## 2019-11-13 ENCOUNTER — Other Ambulatory Visit (HOSPITAL_COMMUNITY)
Admission: RE | Admit: 2019-11-13 | Discharge: 2019-11-13 | Disposition: A | Discharge status: Home / Self Care | Payer: BC Managed Care – PPO | Source: Ambulatory Visit | Attending: Obstetrics and Gynecology | Admitting: Obstetrics and Gynecology

## 2019-11-13 DIAGNOSIS — Z20822 Contact with and (suspected) exposure to covid-19: Secondary | ICD-10-CM | POA: Insufficient documentation

## 2019-11-13 DIAGNOSIS — Z01812 Encounter for preprocedural laboratory examination: Secondary | ICD-10-CM | POA: Insufficient documentation

## 2019-11-13 LAB — SARS CORONAVIRUS 2 (TAT 6-24 HRS): SARS Coronavirus 2: NEGATIVE

## 2019-11-14 ENCOUNTER — Encounter (HOSPITAL_BASED_OUTPATIENT_CLINIC_OR_DEPARTMENT_OTHER): Payer: Self-pay | Admitting: Obstetrics and Gynecology

## 2019-11-14 ENCOUNTER — Ambulatory Visit (HOSPITAL_BASED_OUTPATIENT_CLINIC_OR_DEPARTMENT_OTHER): Payer: BC Managed Care – PPO | Admitting: Anesthesiology

## 2019-11-14 ENCOUNTER — Ambulatory Visit (HOSPITAL_BASED_OUTPATIENT_CLINIC_OR_DEPARTMENT_OTHER)
Admission: RE | Admit: 2019-11-14 | Discharge: 2019-11-14 | Disposition: A | Discharge status: Home / Self Care | Payer: BC Managed Care – PPO | Attending: Obstetrics and Gynecology | Admitting: Obstetrics and Gynecology

## 2019-11-14 ENCOUNTER — Encounter (HOSPITAL_BASED_OUTPATIENT_CLINIC_OR_DEPARTMENT_OTHER)
Admission: RE | Disposition: A | Discharge status: Home / Self Care | Payer: Self-pay | Source: Home / Self Care | Attending: Obstetrics and Gynecology

## 2019-11-14 DIAGNOSIS — E039 Hypothyroidism, unspecified: Secondary | ICD-10-CM | POA: Diagnosis not present

## 2019-11-14 DIAGNOSIS — K660 Peritoneal adhesions (postprocedural) (postinfection): Secondary | ICD-10-CM | POA: Insufficient documentation

## 2019-11-14 DIAGNOSIS — D271 Benign neoplasm of left ovary: Secondary | ICD-10-CM | POA: Diagnosis not present

## 2019-11-14 DIAGNOSIS — F419 Anxiety disorder, unspecified: Secondary | ICD-10-CM | POA: Insufficient documentation

## 2019-11-14 DIAGNOSIS — N838 Other noninflammatory disorders of ovary, fallopian tube and broad ligament: Secondary | ICD-10-CM | POA: Diagnosis not present

## 2019-11-14 DIAGNOSIS — F909 Attention-deficit hyperactivity disorder, unspecified type: Secondary | ICD-10-CM | POA: Diagnosis not present

## 2019-11-14 DIAGNOSIS — G932 Benign intracranial hypertension: Secondary | ICD-10-CM | POA: Insufficient documentation

## 2019-11-14 DIAGNOSIS — Z9049 Acquired absence of other specified parts of digestive tract: Secondary | ICD-10-CM | POA: Insufficient documentation

## 2019-11-14 DIAGNOSIS — F329 Major depressive disorder, single episode, unspecified: Secondary | ICD-10-CM | POA: Insufficient documentation

## 2019-11-14 HISTORY — DX: Unspecified ovarian cyst, unspecified side: N83.209

## 2019-11-14 HISTORY — PX: XI ROBOTIC ASSISTED SALPINGECTOMY: SHX6824

## 2019-11-14 HISTORY — DX: Other specified postprocedural states: Z98.890

## 2019-11-14 HISTORY — DX: Other complications of anesthesia, initial encounter: T88.59XA

## 2019-11-14 HISTORY — DX: Hypothyroidism, unspecified: E03.9

## 2019-11-14 HISTORY — DX: Nausea with vomiting, unspecified: R11.2

## 2019-11-14 HISTORY — PX: XI ROBOTIC ASSISTED OOPHORECTOMY: SHX6823

## 2019-11-14 LAB — CBC
HCT: 45.2 % (ref 36.0–46.0)
Hemoglobin: 15.2 g/dL — ABNORMAL HIGH (ref 12.0–15.0)
MCH: 29.8 pg (ref 26.0–34.0)
MCHC: 33.6 g/dL (ref 30.0–36.0)
MCV: 88.6 fL (ref 80.0–100.0)
Platelets: 262 10*3/uL (ref 150–400)
RBC: 5.1 MIL/uL (ref 3.87–5.11)
RDW: 12.2 % (ref 11.5–15.5)
WBC: 7.4 10*3/uL (ref 4.0–10.5)
nRBC: 0 % (ref 0.0–0.2)

## 2019-11-14 LAB — POCT PREGNANCY, URINE: Preg Test, Ur: NEGATIVE

## 2019-11-14 SURGERY — SALPINGECTOMY, ROBOT-ASSISTED
Anesthesia: Choice | Site: Abdomen | Laterality: Left

## 2019-11-14 MED ORDER — DEXAMETHASONE SODIUM PHOSPHATE 10 MG/ML IJ SOLN
INTRAMUSCULAR | Status: AC
  Filled 2019-11-14: qty 1

## 2019-11-14 MED ORDER — HYDROMORPHONE HCL 1 MG/ML IJ SOLN
INTRAMUSCULAR | Status: AC
  Filled 2019-11-14: qty 1

## 2019-11-14 MED ORDER — LIDOCAINE 2% (20 MG/ML) 5 ML SYRINGE
INTRAMUSCULAR | Status: DC | PRN
  Administered 2019-11-14: 100 mg via INTRAVENOUS

## 2019-11-14 MED ORDER — ONDANSETRON HCL 4 MG/2ML IJ SOLN
4.0000 mg | Freq: Once | INTRAMUSCULAR | Status: AC | PRN
  Administered 2019-11-14: 4 mg via INTRAVENOUS

## 2019-11-14 MED ORDER — FENTANYL CITRATE (PF) 250 MCG/5ML IJ SOLN
INTRAMUSCULAR | Status: AC
  Filled 2019-11-14: qty 5

## 2019-11-14 MED ORDER — ESMOLOL HCL 100 MG/10ML IV SOLN
INTRAVENOUS | Status: AC
  Filled 2019-11-14: qty 10

## 2019-11-14 MED ORDER — CELECOXIB 200 MG PO CAPS
ORAL_CAPSULE | ORAL | Status: AC
  Filled 2019-11-14: qty 2

## 2019-11-14 MED ORDER — FENTANYL CITRATE (PF) 100 MCG/2ML IJ SOLN
INTRAMUSCULAR | Status: DC | PRN
  Administered 2019-11-14 (×4): 50 ug via INTRAVENOUS
  Administered 2019-11-14 (×2): 100 ug via INTRAVENOUS
  Administered 2019-11-14: 50 ug via INTRAVENOUS

## 2019-11-14 MED ORDER — ONDANSETRON HCL 4 MG/2ML IJ SOLN
INTRAMUSCULAR | Status: AC
  Filled 2019-11-14: qty 4

## 2019-11-14 MED ORDER — LIDOCAINE 2% (20 MG/ML) 5 ML SYRINGE
INTRAMUSCULAR | Status: AC
  Filled 2019-11-14: qty 5

## 2019-11-14 MED ORDER — PHENYLEPHRINE 40 MCG/ML (10ML) SYRINGE FOR IV PUSH (FOR BLOOD PRESSURE SUPPORT)
PREFILLED_SYRINGE | INTRAVENOUS | Status: DC | PRN
  Administered 2019-11-14: 80 ug via INTRAVENOUS

## 2019-11-14 MED ORDER — FENTANYL CITRATE (PF) 100 MCG/2ML IJ SOLN
INTRAMUSCULAR | Status: AC
  Filled 2019-11-14: qty 2

## 2019-11-14 MED ORDER — CHLORHEXIDINE GLUCONATE 0.12 % MT SOLN: 15.0000 mL | Freq: Once | OROMUCOSAL | Status: DC

## 2019-11-14 MED ORDER — ROCURONIUM BROMIDE 10 MG/ML (PF) SYRINGE
PREFILLED_SYRINGE | INTRAVENOUS | Status: DC | PRN
Start: 2019-11-14 — End: 2019-11-14
  Administered 2019-11-14: 20 mg via INTRAVENOUS
  Administered 2019-11-14: 100 mg via INTRAVENOUS

## 2019-11-14 MED ORDER — SODIUM CHLORIDE 0.9 % IV SOLN
INTRAVENOUS | Status: DC | PRN
  Administered 2019-11-14: 60 mL

## 2019-11-14 MED ORDER — SUGAMMADEX SODIUM 200 MG/2ML IV SOLN
INTRAVENOUS | Status: DC | PRN
  Administered 2019-11-14: 200 mg via INTRAVENOUS

## 2019-11-14 MED ORDER — CELECOXIB 200 MG PO CAPS
400.0000 mg | ORAL_CAPSULE | ORAL | Status: AC
  Administered 2019-11-14: 400 mg via ORAL

## 2019-11-14 MED ORDER — SOD CITRATE-CITRIC ACID 500-334 MG/5ML PO SOLN: 30.0000 mL | ORAL | Status: DC

## 2019-11-14 MED ORDER — ROCURONIUM BROMIDE 10 MG/ML (PF) SYRINGE
PREFILLED_SYRINGE | INTRAVENOUS | Status: AC
  Filled 2019-11-14: qty 10

## 2019-11-14 MED ORDER — MEPERIDINE HCL 25 MG/ML IJ SOLN
6.2500 mg | INTRAMUSCULAR | Status: DC | PRN
  Administered 2019-11-14: 6.25 mg via INTRAVENOUS

## 2019-11-14 MED ORDER — OXYCODONE HCL 5 MG/5ML PO SOLN: 5.0000 mg | Freq: Once | ORAL | Status: AC | PRN

## 2019-11-14 MED ORDER — DEXAMETHASONE SODIUM PHOSPHATE 4 MG/ML IJ SOLN
INTRAMUSCULAR | Status: DC | PRN
  Administered 2019-11-14: 10 mg via INTRAVENOUS

## 2019-11-14 MED ORDER — MEPERIDINE HCL 25 MG/ML IJ SOLN
INTRAMUSCULAR | Status: AC
  Filled 2019-11-14: qty 1

## 2019-11-14 MED ORDER — ESMOLOL HCL 100 MG/10ML IV SOLN
INTRAVENOUS | Status: DC | PRN
Start: 2019-11-14 — End: 2019-11-14
  Administered 2019-11-14: 30 mg via INTRAVENOUS

## 2019-11-14 MED ORDER — OXYCODONE-ACETAMINOPHEN 5-325 MG PO TABS: 1.0000 | ORAL_TABLET | ORAL | 0 refills | Status: DC | PRN

## 2019-11-14 MED ORDER — ONDANSETRON HCL 4 MG/2ML IJ SOLN
INTRAMUSCULAR | Status: DC | PRN
  Administered 2019-11-14: 4 mg via INTRAVENOUS

## 2019-11-14 MED ORDER — OXYCODONE HCL 5 MG PO TABS
5.0000 mg | ORAL_TABLET | Freq: Once | ORAL | Status: AC | PRN
  Administered 2019-11-14: 5 mg via ORAL

## 2019-11-14 MED ORDER — KETOROLAC TROMETHAMINE 30 MG/ML IJ SOLN
INTRAMUSCULAR | Status: AC
  Filled 2019-11-14: qty 1

## 2019-11-14 MED ORDER — LACTATED RINGERS IV SOLN: INTRAVENOUS | Status: DC

## 2019-11-14 MED ORDER — ACETAMINOPHEN 500 MG PO TABS
1000.0000 mg | ORAL_TABLET | ORAL | Status: AC
  Administered 2019-11-14: 1000 mg via ORAL

## 2019-11-14 MED ORDER — ORAL CARE MOUTH RINSE: 15.0000 mL | Freq: Once | OROMUCOSAL | Status: DC

## 2019-11-14 MED ORDER — PROPOFOL 10 MG/ML IV BOLUS
INTRAVENOUS | Status: DC | PRN
  Administered 2019-11-14: 200 mg via INTRAVENOUS

## 2019-11-14 MED ORDER — DEXMEDETOMIDINE HCL IN NACL 200 MCG/50ML IV SOLN
INTRAVENOUS | Status: AC
  Filled 2019-11-14: qty 50

## 2019-11-14 MED ORDER — ONDANSETRON HCL 4 MG/2ML IJ SOLN
INTRAMUSCULAR | Status: AC
  Filled 2019-11-14: qty 2

## 2019-11-14 MED ORDER — KETAMINE HCL 10 MG/ML IJ SOLN
INTRAMUSCULAR | Status: AC
  Filled 2019-11-14: qty 1

## 2019-11-14 MED ORDER — ACETAMINOPHEN 500 MG PO TABS
ORAL_TABLET | ORAL | Status: AC
  Filled 2019-11-14: qty 2

## 2019-11-14 MED ORDER — OXYCODONE HCL 5 MG PO TABS
ORAL_TABLET | ORAL | Status: AC
  Filled 2019-11-14: qty 1

## 2019-11-14 MED ORDER — HYDROMORPHONE HCL 1 MG/ML IJ SOLN
0.2500 mg | INTRAMUSCULAR | Status: DC | PRN
  Administered 2019-11-14 (×4): 0.5 mg via INTRAVENOUS

## 2019-11-14 MED ORDER — KETOROLAC TROMETHAMINE 15 MG/ML IJ SOLN: 15.0000 mg | INTRAMUSCULAR | Status: DC

## 2019-11-14 MED ORDER — GABAPENTIN 300 MG PO CAPS
300.0000 mg | ORAL_CAPSULE | ORAL | Status: AC
  Administered 2019-11-14: 300 mg via ORAL

## 2019-11-14 MED ORDER — GABAPENTIN 300 MG PO CAPS
ORAL_CAPSULE | ORAL | Status: AC
  Filled 2019-11-14: qty 1

## 2019-11-14 MED ORDER — SODIUM CHLORIDE 0.9 % IR SOLN
Status: DC | PRN
  Administered 2019-11-14: 3000 mL

## 2019-11-14 SURGICAL SUPPLY — 53 items
BARRIER ADHS 3X4 INTERCEED (GAUZE/BANDAGES/DRESSINGS) IMPLANT
CANISTER SUCT 3000ML PPV (MISCELLANEOUS) ×1 IMPLANT
COVER BACK TABLE 60X90IN (DRAPES) ×2 IMPLANT
COVER TIP SHEARS 8 DVNC (MISCELLANEOUS) ×1 IMPLANT
COVER TIP SHEARS 8MM DA VINCI (MISCELLANEOUS) ×1
DECANTER SPIKE VIAL GLASS SM (MISCELLANEOUS) ×2 IMPLANT
DEFOGGER SCOPE WARMER CLEARIFY (MISCELLANEOUS) ×2 IMPLANT
DERMABOND ADVANCED (GAUZE/BANDAGES/DRESSINGS) ×1
DERMABOND ADVANCED .7 DNX12 (GAUZE/BANDAGES/DRESSINGS) ×1 IMPLANT
DRAPE ARM DVNC X/XI (DISPOSABLE) ×4 IMPLANT
DRAPE COLUMN DVNC XI (DISPOSABLE) ×1 IMPLANT
DRAPE DA VINCI XI ARM (DISPOSABLE) ×4
DRAPE DA VINCI XI COLUMN (DISPOSABLE) ×1
DURAPREP 26ML APPLICATOR (WOUND CARE) ×2 IMPLANT
ELECT REM PT RETURN 9FT ADLT (ELECTROSURGICAL) ×2
ELECTRODE REM PT RTRN 9FT ADLT (ELECTROSURGICAL) ×1 IMPLANT
GLOVE BIO SURGEON STRL SZ7 (GLOVE) ×6 IMPLANT
GLOVE BIOGEL PI IND STRL 7.0 (GLOVE) ×2 IMPLANT
GLOVE BIOGEL PI INDICATOR 7.0 (GLOVE) ×2
IRRIG SUCT STRYKERFLOW 2 WTIP (MISCELLANEOUS) ×2
IRRIGATION SUCT STRKRFLW 2 WTP (MISCELLANEOUS) ×1 IMPLANT
LEGGING LITHOTOMY PAIR STRL (DRAPES) ×2 IMPLANT
MANIPULATOR ADVINCU DEL 2.5 PL (MISCELLANEOUS) IMPLANT
MANIPULATOR ADVINCU DEL 3.0 PL (MISCELLANEOUS) IMPLANT
MANIPULATOR ADVINCU DEL 3.5 PL (MISCELLANEOUS) IMPLANT
MANIPULATOR ADVINCU DEL 4.0 PL (MISCELLANEOUS) IMPLANT
NEEDLE INSUFFLATION 120MM (ENDOMECHANICALS) ×2 IMPLANT
OBTURATOR OPTICAL STANDARD 8MM (TROCAR) ×1
OBTURATOR OPTICAL STND 8 DVNC (TROCAR) ×1
OBTURATOR OPTICALSTD 8 DVNC (TROCAR) ×1 IMPLANT
PACK ROBOT WH (CUSTOM PROCEDURE TRAY) ×2 IMPLANT
PACK ROBOTIC GOWN (GOWN DISPOSABLE) ×2 IMPLANT
PACK TRENDGUARD 450 HYBRID PRO (MISCELLANEOUS) IMPLANT
PAD PREP 24X48 CUFFED NSTRL (MISCELLANEOUS) ×2 IMPLANT
POUCH LAPAROSCOPIC INSTRUMENT (MISCELLANEOUS) ×2 IMPLANT
POUCH SPECIMEN RETRIEVAL 10MM (ENDOMECHANICALS) ×1 IMPLANT
PROTECTOR NERVE ULNAR (MISCELLANEOUS) ×4 IMPLANT
SEAL CANN UNIV 5-8 DVNC XI (MISCELLANEOUS) ×3 IMPLANT
SEAL XI 5MM-8MM UNIVERSAL (MISCELLANEOUS) ×3
SET IRRIG Y TYPE TUR BLADDER L (SET/KITS/TRAYS/PACK) ×2 IMPLANT
SET TRI-LUMEN FLTR TB AIRSEAL (TUBING) ×2 IMPLANT
SUT VIC AB 0 CT1 36 (SUTURE) ×2 IMPLANT
SUT VIC AB 2-0 UR6 27 (SUTURE) ×2 IMPLANT
SUT VICRYL RAPIDE 3 0 (SUTURE) ×4 IMPLANT
SUT VLOC 180 0 9IN  GS21 (SUTURE)
SUT VLOC 180 0 9IN GS21 (SUTURE) ×1 IMPLANT
TOWEL OR 17X26 10 PK STRL BLUE (TOWEL DISPOSABLE) ×3 IMPLANT
TRAY FOLEY W/BAG SLVR 14FR (SET/KITS/TRAYS/PACK) ×2 IMPLANT
TRENDGUARD 450 HYBRID PRO PACK (MISCELLANEOUS) ×2
TROCAR BLADELESS OPT 5 100 (ENDOMECHANICALS) IMPLANT
TROCAR PORT AIRSEAL 5X120 (TROCAR) ×2 IMPLANT
TROCAR XCEL NON-BLD 11X100MML (ENDOMECHANICALS) ×1 IMPLANT
WATER STERILE IRR 1000ML POUR (IV SOLUTION) ×2 IMPLANT

## 2019-11-14 NOTE — Anesthesia Postprocedure Evaluation (Signed)
Anesthesia Post Note  Patient: Chief Financial Officer  Procedure(s) Performed: XI ROBOTIC ASSISTED SALPINGECTOMY (Left Abdomen) XI ROBOTIC ASSISTED OOPHORECTOMY (Left Abdomen)     Patient location during evaluation: PACU Anesthesia Type: General Level of consciousness: awake and alert and oriented Pain management: pain level controlled Vital Signs Assessment: post-procedure vital signs reviewed and stable Respiratory status: spontaneous breathing, nonlabored ventilation and respiratory function stable Cardiovascular status: blood pressure returned to baseline and stable Postop Assessment: no apparent nausea or vomiting Anesthetic complications: no    Last Vitals:  Vitals:   11/14/19 1515 11/14/19 1530  BP: 132/71 119/79  Pulse: 91 87  Resp: 16 12  Temp:    SpO2: 93% 99%    Last Pain:  Vitals:   11/14/19 1528  TempSrc:   PainSc: 8                  Jeson Camacho A.

## 2019-11-14 NOTE — Transfer of Care (Signed)
Immediate Anesthesia Transfer of Care Note  Patient: Akyah Ozawa  Procedure(s) Performed: XI ROBOTIC ASSISTED SALPINGECTOMY (Left Abdomen) XI ROBOTIC ASSISTED OOPHORECTOMY (Left Abdomen)  Patient Location: PACU  Anesthesia Type:General  Level of Consciousness: awake and alert   Airway & Oxygen Therapy: Patient Spontanous Breathing and Patient connected to face mask oxygen  Post-op Assessment: Report given to RN and Post -op Vital signs reviewed and stable  Post vital signs: Reviewed and stable  Last Vitals:  Vitals Value Taken Time  BP 136/75 11/14/19 1445  Temp    Pulse 89 11/14/19 1447  Resp 19 11/14/19 1447  SpO2 100 % 11/14/19 1447  Vitals shown include unvalidated device data.  Last Pain:  Vitals:   11/14/19 0930  TempSrc: Oral  PainSc: 0-No pain      Patients Stated Pain Goal: 5 ([phone] [id number])  Complications: No apparent anesthesia complications

## 2019-11-14 NOTE — Brief Op Note (Signed)
11/14/2019  2:43 PM  PATIENT:  Ardelle Park Eckrich  24 y.o. female  PRE-OPERATIVE DIAGNOSIS:  OVARIAN CYST  POST-OPERATIVE DIAGNOSIS:  OVARIAN CYST  PROCEDURE:  Procedure(s): XI ROBOTIC ASSISTED SALPINGECTOMY (Left) XI ROBOTIC ASSISTED OOPHORECTOMY (Left)  SURGEON:  Surgeon(s) and Role:    * Bobbye Charleston, MD - Primary    * Taam-Akelman, Lawrence Santiago, MD - Assisting  ANESTHESIA:   general  EBL:  50 mL   LOCAL MEDICATIONS USED:  OTHER Ropivicaine  SPECIMEN:  Source of Specimen:  L ovary and tube  DISPOSITION OF SPECIMEN:  PATHOLOGY  COUNTS:  YES  TOURNIQUET:  * No tourniquets in log *  DICTATION: .Note written in EPIC  PLAN OF CARE: Discharge to home after PACU  PATIENT DISPOSITION:  PACU - hemodynamically stable.   Delay start of Pharmacological VTE agent (>24hrs) due to surgical blood loss or risk of bleeding: not applicable

## 2019-11-14 NOTE — Discharge Instructions (Signed)
Salpingectomy, Care After This sheet gives you information about how to care for yourself after your procedure. Your health care provider may also give you more specific instructions. If you have problems or questions, contact your health care provider. What can I expect after the procedure? After the procedure, it is common to have:  Pain in your abdomen.  Some light vaginal bleeding (spotting) for a few days.  Tiredness. Your recovery time will vary depending on which method your surgeon used for your surgery. Follow these instructions at home: Incision care   Follow instructions from your health care provider about how to take care of your incisions. Make sure you: ? Wash your hands with soap and water before and after you change your bandage (dressing). If soap and water are not available, use hand sanitizer. ? Change or remove your dressing as told by your health care provider. ? Leave any stitches (sutures), skin glue, or adhesive strips in place. These skin closures may need to stay in place for 2 weeks or longer. If adhesive strip edges start to loosen and curl up, you may trim the loose edges. Do not remove adhesive strips completely unless your health care provider tells you to do that.  Keep your dressing clean and dry.  Check your incision area every day for signs of infection. Check for: ? Redness, swelling, or pain that gets worse. ? Fluid or blood. ? Warmth. ? Pus or a bad smell. Activity  Rest as told by your health care provider.  Avoid sitting for a long time without moving. Get up to take short walks every 1-2 hours. This is important to improve blood flow and breathing. Ask for help if you feel weak or unsteady.  Return to your normal activities as told by your health care provider. Ask your health care provider what activities are safe for you.  Do not drive until your health care provider says that it is safe.  Do not lift anything that is heavier than 10 lb  (4.5 kg), or the limit that you are told, until your health care provider says that it is safe. This may be 2-6 weeks depending on your surgery.  Until your health care provider approves: ? Do not douche. ? Do not use tampons. ? Do not have sex. Medicines  Take over-the-counter and prescription medicines only as told by your health care provider.  Ask your health care provider if the medicine prescribed to you: ? Requires you to avoid driving or using heavy machinery. ? Can cause constipation. You may need to take actions to prevent or treat constipation, such as:  Drink enough fluid to keep your urine pale yellow.  Take over-the-counter or prescription medicines.  Eat foods that are high in fiber, such as beans, whole grains, and fresh fruits and vegetables.  Limit foods that are high in fat and processed sugars, such as fried or sweet foods. General instructions  Wear compression stockings as told by your health care provider. These stockings help to prevent blood clots and reduce swelling in your legs.  Do not use any products that contain nicotine or tobacco, such as cigarettes, e-cigarettes, and chewing tobacco. If you need help quitting, ask your health care provider.  Do not take baths, swim, or use a hot tub until your health care provider approves. You may take showers.  Keep all follow-up visits as told by your health care provider. This is important. Contact a health care provider if you have:  Pain  when you urinate.  Redness, swelling, or pain around an incision.  Fluid or blood coming from an incision.  Pus or a bad smell coming from an incision.  An incision that feels warm to the touch.  A fever.  Abdominal pain that gets worse or does not get better with medicine.  An incision that starts to break open.  A rash.  Light-headedness.  Nausea and vomiting. Get help right away if you:  Have pain in your chest or leg.  Develop shortness of  breath.  Faint.  Have increased or heavy vaginal bleeding, such as soaking a pad in an hour. Summary  After the procedure, it is common to feel tired, have some pain in your abdomen, and have some light vaginal bleeding for a few days.  Follow instructions from your health care provider about how to take care of your incisions.  Return to your normal activities as told by your health care provider. Ask your health care provider what activities are safe for you.  Do not douche, use tampons, or have sex until your health care provider approves.  Keep all follow-up visits as told by your health care provider. This information is not intended to replace advice given to you by your health care provider. Make sure you discuss any questions you have with your health care provider. Document Revised: 05/22/2018 Document Reviewed: 05/22/2018 Elsevier Patient Education  [place] Instructions  Activity: Get plenty of rest for the remainder of the day. A responsible individual must stay with you for 24 hours following the procedure.  For the next 24 hours, DO NOT: -Drive a car -Paediatric nurse -Drink alcoholic beverages -Take any medication unless instructed by your physician -Make any legal decisions or sign important papers.  Meals: Start with liquid foods such as gelatin or soup. Progress to regular foods as tolerated. Avoid greasy, spicy, heavy foods. If nausea and/or vomiting occur, drink only clear liquids until the nausea and/or vomiting subsides. Call your physician if vomiting continues.  Special Instructions/Symptoms: Your throat may feel dry or sore from the anesthesia or the breathing tube placed in your throat during surgery. If this causes discomfort, gargle with warm salt water. The discomfort should disappear within 24 hours.

## 2019-11-14 NOTE — Anesthesia Procedure Notes (Signed)
Procedure Name: Intubation Date/Time: 11/14/2019 12:32 PM Performed by: Eulas Post, Cervando Durnin W, CRNA Pre-anesthesia Checklist: Patient identified, Emergency Drugs available, Suction available and Patient being monitored Patient Re-evaluated:Patient Re-evaluated prior to induction Oxygen Delivery Method: Circle system utilized Preoxygenation: Pre-oxygenation with 100% oxygen Induction Type: IV induction Ventilation: Mask ventilation without difficulty Laryngoscope Size: Miller and 2 Grade View: Grade I Tube type: Oral Tube size: 7.0 mm Number of attempts: 1 Airway Equipment and Method: Stylet Placement Confirmation: ETT inserted through vocal cords under direct vision,  positive ETCO2 and breath sounds checked- equal and bilateral Secured at: 22 cm Tube secured with: Tape Dental Injury: Teeth and Oropharynx as per pre-operative assessment

## 2019-11-14 NOTE — Op Note (Signed)
Complications:  None.  Findings:  10 cm L Ovary which was twisted once on itself and stalk  Some adhesions of sigmoid to peritoneum.  Appendix and cul de sac were otherwise normal.  The ureters were seen well out of all fields of dissection.  R ovary and tube were normal.  Meds: Ropivicaine  Technique:  After adequate general anesthesia was achieved, the patient was prepped and draped in sterile fashion.  The speculum was placed into the vagina and the uterine manipulator placed in the cervix. The speculum was removed and a catheter placed to drain the bladder during the surgery.  Attention was turned to the abdomen and a  2 cm incision was made above the umbilicus.  The veress needle passed into the abdomen without aspiration of bowel contents or blood.  The 12 mm trocar for the camera port was introduced after insuflatation and the above findings noted.  Two 8.5 mm trocars were introduced 10 cm either side of the camera port under direct visualization.  The bipolar forceps were introduced on arm 1 and the Hot Shears on arm 1.      The adhesions of the omentum were removed from the ovary with cautery and the IP ligament identified.  The IP was isolated by incising the peritoneum above and parallel to it with the hot shears.  The IP was then cauterized in two places and incised with the scissors.  The ureter was seen well below th field of dissection.  The R ovary was then removed with careful cautery on the uterine ovarian ligament.  The ovary was then placed in the cul de sac.  However, while cauterizing the IP, the tube was so close to the IP that despite our efforts to keep it away from the cautery, an isthmic section was inadvertently cauterized.  I felt that leaving a partially damaged tube would likely cause a future ectopic and so I elected to remove it. The tube was grasped and cautery with both bipolar and scissors was used to remove the tube and fimbriated end. Hemostasis was assured with the  insufflation down.  The Robot was then undocked.  The 5 mm scope was introduced, the camera port changed to 10 mm trocare and  and the tube removed through the port.  The ovary was placed inside an endobag.  The bag was then brought through the umbilical incision and opened to the outside.  The tube was removed with an allis clamp and the ovary decompressed with a multiple dissections with the kocher and scapel.  There were multiple cysts with thick mucus in them.  The umbilical camera incision was extended to 3 cm and the mucus and ovary were all successfully removed from the abdomen without any intraperitoneal spill and sent to pathology.  Hemostasis was checked and the sites of operation were dry.    All instruments were removed and the abdomen desuflated.  Ropivicaine was placed in pelvis. The  3 cm trocar/mini lap site fascia was closed with a running stitch of 2-Vicryl.  All skin incisions were closed with subcuticular stitches and Dermabond.  All instruments were withdrawn from the vagina.  Pt tolerated the procedure well and was returned to the recovery room in stable condition.    Elinor Kleine A

## 2019-11-14 NOTE — Anesthesia Preprocedure Evaluation (Addendum)
Anesthesia Evaluation  Patient identified by MRN, date of birth, ID band Patient awake    Reviewed: Allergy & Precautions, NPO status , Patient's Chart, lab work & pertinent test results  History of Anesthesia Complications (+) PONV  Airway Mallampati: II  TM Distance: >3 FB Neck ROM: Full    Dental   Pulmonary    Pulmonary exam normal        Cardiovascular Normal cardiovascular exam     Neuro/Psych Anxiety Depression    GI/Hepatic   Endo/Other    Renal/GU      Musculoskeletal   Abdominal   Peds  Hematology   Anesthesia Other Findings   Reproductive/Obstetrics                             Anesthesia Physical Anesthesia Plan  ASA: III  Anesthesia Plan: General   Post-op Pain Management:    Induction: Intravenous  PONV Risk Score and Plan: 3 and Midazolam, Ondansetron and Dexamethasone  Airway Management Planned: Oral ETT  Additional Equipment:   Intra-op Plan:   Post-operative Plan: Extubation in OR  Informed Consent: I have reviewed the patients History and Physical, chart, labs and discussed the procedure including the risks, benefits and alternatives for the proposed anesthesia with the patient or authorized representative who has indicated his/her understanding and acceptance.       Plan Discussed with: CRNA and Surgeon  Anesthesia Plan Comments: (Paradoxical reaction to Benzodiazepines. Will avoid them.)      Anesthesia Quick Evaluation

## 2019-11-14 NOTE — Progress Notes (Signed)
There has been no change in the patients history, status or exam since the history and physical.  Vitals:   10/31/19 1811 11/06/19 1306 11/14/19 0930  BP:   128/85  Pulse:   83  Resp:   (!) 22  Temp:   97.6 F (36.4 C)  TempSrc:   Oral  SpO2:   100%  Weight: 122.5 kg 122.5 kg 121.8 kg  Height: 5\' 8"  (1.727 m) 5\' 8"  (1.727 m) 5\' 8"  (1.727 m)    Results for orders placed or performed during the hospital encounter of 11/14/19 (from the past 72 hour(s))  Pregnancy, urine POC     Status: None   Collection Time: 11/14/19  9:03 AM  Result Value Ref Range   Preg Test, Ur NEGATIVE NEGATIVE    Comment:        THE SENSITIVITY OF THIS METHODOLOGY IS >24 mIU/mL   CBC     Status: Abnormal   Collection Time: 11/14/19  9:42 AM  Result Value Ref Range   WBC 7.4 4.0 - 10.5 K/uL   RBC 5.10 3.87 - 5.11 MIL/uL   Hemoglobin 15.2 (H) 12.0 - 15.0 g/dL   HCT 45.2 36.0 - 46.0 %   MCV 88.6 80.0 - 100.0 fL   MCH 29.8 26.0 - 34.0 pg   MCHC 33.6 30.0 - 36.0 g/dL   RDW 12.2 11.5 - 15.5 %   Platelets 262 150 - 400 K/uL   nRBC 0.0 0.0 - 0.2 %    Comment: Performed at [facility], [place] [address], [place],  [address]   Vitals:   10/31/19 1811 11/06/19 1306 11/14/19 0930  BP:   128/85  Pulse:   83  Resp:   (!) 22  Temp:   97.6 F (36.4 C)  TempSrc:   Oral  SpO2:   100%  Weight: 122.5 kg 122.5 kg 121.8 kg  Height: 5\' 8"  (1.727 m) 5\' 8"  (1.727 m) 5\' 8"  (1.727 m)    Daria Pastures

## 2019-11-16 LAB — SURGICAL PATHOLOGY

## 2019-12-19 ENCOUNTER — Inpatient Hospital Stay: Payer: BC Managed Care – PPO | Admitting: Family Medicine

## 2019-12-20 ENCOUNTER — Encounter: Payer: Self-pay | Admitting: Family Medicine

## 2019-12-20 ENCOUNTER — Telehealth (INDEPENDENT_AMBULATORY_CARE_PROVIDER_SITE_OTHER): Payer: BC Managed Care – PPO | Admitting: Family Medicine

## 2019-12-20 VITALS — Ht 68.0 in | Wt 271.0 lb

## 2019-12-20 DIAGNOSIS — F411 Generalized anxiety disorder: Secondary | ICD-10-CM

## 2019-12-20 DIAGNOSIS — F321 Major depressive disorder, single episode, moderate: Secondary | ICD-10-CM | POA: Diagnosis not present

## 2019-12-20 MED ORDER — ESCITALOPRAM OXALATE 20 MG PO TABS: ORAL_TABLET | ORAL | 3 refills | Status: DC

## 2019-12-20 NOTE — Progress Notes (Signed)
Virtual Visit via Video Note  I connected with Renee Jensen on 12/20/19 at  8:30 AM EDT by a video enabled telemedicine application and verified that I am speaking with the correct person using two identifiers. Location patient: home Location provider: work  Persons participating in the virtual visit: patient, provider  I discussed the limitations of evaluation and management by telemedicine and the availability of in person appointments. The patient expressed understanding and agreed to proceed.  Chief Complaint  Patient presents with  . Anxiety    Pt had surgery June 2,2021 for tumor removal and went off her medication and now the pt is asking to speak to PCP about restarting medication.     HPI: Renee Jensen is a 24 y.o. female who is s/p Lt oophorectomy and salpingectomy on 11/14/19 performed by Dr. Philis Pique.  Pt did not restart meds after hospital discharge.  Pt is having trouble falling asleep but does stay asleep. She notes increased irritability, more emotional. She does feel pretty good overall.  She feels like she needs to restart meds but would like to limit number of meds.    Depression screen [facility] 2/9 12/20/2019 06/20/2019 01/26/2019  Decreased Interest 1 2 2   Down, Depressed, Hopeless 0 2 3  PHQ - 2 Score 1 4 5   Altered sleeping 2 3 3   Tired, decreased energy 3 2 3   Change in appetite 0 0 3  Feeling bad or failure about yourself  0 1 2  Trouble concentrating 2 1 3   Moving slowly or fidgety/restless 1 2 3   Suicidal thoughts 0 0 1  PHQ-9 Score 9 13 23   Difficult doing work/chores Somewhat difficult - -   GAD 7 : Generalized Anxiety Score 12/20/2019 06/20/2019 01/26/2019 12/29/2018  Nervous, Anxious, on Edge 2 3 3 3   Control/stop worrying 2 3 3 3   Worry too much - different things 2 3 3 3   Trouble relaxing 3 3 3 3   Restless 2 3 3 3   Easily annoyed or irritable 3 2 3 3   Afraid - awful might happen 0 3 2 3   Total GAD 7 Score 14 20 20 21   Anxiety Difficulty Extremely difficult -  - -     Past Medical History:  Diagnosis Date  . ADHD   . Anxiety   . Complication of anesthesia    "wakes up crying"  . Depression   . Hypothyroidism   . Ovarian cyst   . PONV (postoperative nausea and vomiting)    with some surgeries  . Pseudotumor cerebri    no recent symptoms, [facility] [place]  . Thyroid disease     Past Surgical History:  Procedure Laterality Date  . ADENOIDECTOMY  1998  . DILATION AND EVACUATION N/A 06/27/2015   Procedure: DILATATION AND EVACUATION;  Surgeon: Bobbye Charleston, MD;  Location: [facility];  Service: Gynecology;  Laterality: N/A;  . DILATION AND EVACUATION N/A 10/25/2015   Procedure: DILATATION AND EVACUATION;  Surgeon: Bobbye Charleston, MD;  Location: [facility];  Service: Gynecology;  Laterality: N/A;  . LAPAROSCOPIC CHOLECYSTECTOMY  04/2014  . TONSILLECTOMY  2018  . TYMPANOSTOMY TUBE PLACEMENT  as child 30  . [facility] teeth extraction  2021  . XI ROBOTIC ASSISTED OOPHORECTOMY Left 11/14/2019   Procedure: XI ROBOTIC ASSISTED OOPHORECTOMY;  Surgeon: Bobbye Charleston, MD;  Location: [facility];  Service: Gynecology;  Laterality: Left;  . XI ROBOTIC ASSISTED SALPINGECTOMY Left 11/14/2019   Procedure: XI ROBOTIC ASSISTED SALPINGECTOMY;  Surgeon: Bobbye Charleston, MD;  Location: [facility];  Service: Gynecology;  Laterality: Left;    Family History  Problem Relation Age of Onset  . Ulcerative colitis Mother   . Rheum arthritis Mother   . Fibromyalgia Mother   . Hypothyroidism Mother   . Kidney disease Mother   . Diabetes Father   . Hypertension Father   . Heart disease Father   . Hyperlipidemia Father     Social History   Tobacco Use  . Smoking status: Never Smoker  . Smokeless tobacco: Never Used  Vaping Use  . Vaping Use: Never used  Substance Use Topics  . Alcohol use: Yes    Comment: socially  . Drug use: No     Current Outpatient Medications:  .   amphetamine-dextroamphetamine (ADDERALL XR) 25 MG 24 hr capsule, Take 1 capsule by mouth every morning., Disp: 30 capsule, Rfl: 0 .  levonorgestrel (KYLEENA) 19.5 MG IUD, by Intrauterine route once. Feb 2018 or 2019, Disp: , Rfl:  .  levothyroxine (SYNTHROID, LEVOTHROID) 112 MCG tablet, Take 112 mcg by mouth daily before breakfast., Disp: , Rfl:  .  promethazine (PHENERGAN) 12.5 MG tablet, Take 1 tablet (12.5 mg total) by mouth every 6 (six) hours as needed for nausea or vomiting., Disp: 30 tablet, Rfl: 0 .  traZODone (DESYREL) 50 MG tablet, 1/2 to 1 tab po qHS PRN (Patient taking differently: Take 50 mg by mouth at bedtime as needed for sleep. ), Disp: 30 tablet, Rfl: 0 .  buPROPion (WELLBUTRIN XL) 300 MG 24 hr tablet, Take 1 tablet (300 mg total) by mouth daily. (Patient not taking: Reported on 12/20/2019), Disp: 90 tablet, Rfl: 3 .  busPIRone (BUSPAR) 10 MG tablet, Take 1 tablet (10 mg total) by mouth 3 (three) times daily. (Patient not taking: Reported on 12/20/2019), Disp: 270 tablet, Rfl: 3 .  escitalopram (LEXAPRO) 20 MG tablet, Take 1 tablet (20 mg total) by mouth daily. (Patient not taking: Reported on 12/20/2019), Disp: 90 tablet, Rfl: 3 .  oxyCODONE-acetaminophen (PERCOCET/ROXICET) 5-325 MG tablet, Take 1 tablet by mouth every 4 (four) hours as needed for severe pain. (Patient not taking: Reported on 12/20/2019), Disp: 30 tablet, Rfl: 0  Allergies  Allergen Reactions  . Diazepam Shortness Of Breath, Anxiety and Other (See Comments)    Severe anxiety with all benzos Severe anxiety   . Alprazolam Anxiety and Other (See Comments)    Has opposite effect with xanax Hysterical crying, unable to walk and talk        ROS: See pertinent positives and negatives per HPI.   EXAM:  VITALS per patient if applicable: Ht 5\' 8"  (1.727 m)   Wt 271 lb (122.9 kg)   BMI 41.21 kg/m    GENERAL: alert, oriented, appears well and in no acute distress  HEENT: atraumatic, conjunctiva clear, no obvious  abnormalities on inspection of external nose and ears  NECK: normal movements of the head and neck  LUNGS: on inspection no signs of respiratory distress, breathing rate appears normal, no obvious gross SOB, gasping or wheezing, no conversational dyspnea  CV: no obvious cyanosis  PSYCH/NEURO: pleasant and cooperative, speech and thought processing grossly intact   ASSESSMENT AND PLAN:  1. Generalized anxiety disorder 2. Moderate major depression (HCC) - GAD-7 = 14, PHQ-9 = 9 Restart: - escitalopram (LEXAPRO) 20 MG tablet; 1/2 tab po daily x 1 week then 1 tab po daily  Dispense: 90 tablet; Refill: 3 - will hold off on other meds (  wellbutrin and buspar) at this time since pt has been off all meds x 2 mo and feels overall good from a mental health standpoint. She does endorse increased irritability and more emotional.  - f/u in 3-4 wks or sooner PRN   I discussed the assessment and treatment plan with the patient. The patient was provided an opportunity to ask questions and all were answered. The patient agreed with the plan and demonstrated an understanding of the instructions.   The patient was advised to call back or seek an in-person evaluation if the symptoms worsen or if the condition fails to improve as anticipated.   Letta Median, DO

## 2020-01-05 ENCOUNTER — Other Ambulatory Visit: Payer: Self-pay

## 2020-01-05 ENCOUNTER — Encounter (HOSPITAL_COMMUNITY): Payer: Self-pay | Admitting: Pediatrics

## 2020-01-05 ENCOUNTER — Emergency Department (HOSPITAL_COMMUNITY)
Admission: EM | Admit: 2020-01-05 | Discharge: 2020-01-06 | Disposition: A | Discharge status: Home / Self Care | Payer: BC Managed Care – PPO | Attending: Emergency Medicine | Admitting: Emergency Medicine

## 2020-01-05 DIAGNOSIS — R102 Pelvic and perineal pain: Secondary | ICD-10-CM | POA: Insufficient documentation

## 2020-01-05 DIAGNOSIS — N939 Abnormal uterine and vaginal bleeding, unspecified: Secondary | ICD-10-CM | POA: Insufficient documentation

## 2020-01-05 DIAGNOSIS — Z79899 Other long term (current) drug therapy: Secondary | ICD-10-CM | POA: Diagnosis not present

## 2020-01-05 DIAGNOSIS — E039 Hypothyroidism, unspecified: Secondary | ICD-10-CM | POA: Insufficient documentation

## 2020-01-05 LAB — COMPREHENSIVE METABOLIC PANEL
ALT: 40 U/L (ref 0–44)
AST: 24 U/L (ref 15–41)
Albumin: 3.5 g/dL (ref 3.5–5.0)
Alkaline Phosphatase: 72 U/L (ref 38–126)
Anion gap: 8 (ref 5–15)
BUN: 18 mg/dL (ref 6–20)
CO2: 24 mmol/L (ref 22–32)
Calcium: 9.2 mg/dL (ref 8.9–10.3)
Chloride: 105 mmol/L (ref 98–111)
Creatinine, Ser: 0.9 mg/dL (ref 0.44–1.00)
GFR calc Af Amer: >60 mL/min (ref >60)
GFR calc non Af Amer: >60 mL/min (ref >60)
Glucose, Bld: 93 mg/dL (ref 70–99)
Potassium: 4.2 mmol/L (ref 3.5–5.1)
Sodium: 137 mmol/L (ref 135–145)
Total Bilirubin: 0.4 mg/dL (ref 0.3–1.2)
Total Protein: 7.1 g/dL (ref 6.5–8.1)

## 2020-01-05 LAB — CBC WITH DIFFERENTIAL/PLATELET
Abs Immature Granulocytes: 0.04 10*3/uL (ref 0.00–0.07)
Basophils Absolute: 0.1 10*3/uL (ref 0.0–0.1)
Basophils Relative: 1 %
Eosinophils Absolute: 0.1 10*3/uL (ref 0.0–0.5)
Eosinophils Relative: 2 %
HCT: 42.5 % (ref 36.0–46.0)
Hemoglobin: 14 g/dL (ref 12.0–15.0)
Immature Granulocytes: 1 %
Lymphocytes Relative: 30 %
Lymphs Abs: 2.5 10*3/uL (ref 0.7–4.0)
MCH: 30 pg (ref 26.0–34.0)
MCHC: 32.9 g/dL (ref 30.0–36.0)
MCV: 91 fL (ref 80.0–100.0)
Monocytes Absolute: 0.6 10*3/uL (ref 0.1–1.0)
Monocytes Relative: 8 %
Neutro Abs: 4.8 10*3/uL (ref 1.7–7.7)
Neutrophils Relative %: 58 %
Platelets: 296 10*3/uL (ref 150–400)
RBC: 4.67 MIL/uL (ref 3.87–5.11)
RDW: 12.8 % (ref 11.5–15.5)
WBC: 8.1 10*3/uL (ref 4.0–10.5)
nRBC: 0 % (ref 0.0–0.2)

## 2020-01-05 LAB — URINALYSIS, COMPLETE (UACMP) WITH MICROSCOPIC
Bacteria, UA: NONE SEEN
Bilirubin Urine: NEGATIVE
Glucose, UA: NEGATIVE mg/dL
Ketones, ur: NEGATIVE mg/dL
Leukocytes,Ua: NEGATIVE
Nitrite: NEGATIVE
Protein, ur: NEGATIVE mg/dL
Specific Gravity, Urine: 1.008 (ref 1.005–1.030)
pH: 5 (ref 5.0–8.0)

## 2020-01-05 LAB — I-STAT BETA HCG BLOOD, ED (MC, WL, AP ONLY): I-stat hCG, quantitative: <5 m[IU]/mL (ref <5)

## 2020-01-05 NOTE — ED Triage Notes (Signed)
Pt c/o left lower abdominal pain started last night. 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 C/O NV as well, denies problem peeing.

## 2020-01-06 ENCOUNTER — Emergency Department (HOSPITAL_COMMUNITY): Payer: BC Managed Care – PPO

## 2020-01-06 LAB — WET PREP, GENITAL
Sperm: NONE SEEN
Trich, Wet Prep: NONE SEEN
Yeast Wet Prep HPF POC: NONE SEEN

## 2020-01-06 MED ORDER — OXYCODONE-ACETAMINOPHEN 5-325 MG PO TABS
1.0000 | ORAL_TABLET | Freq: Once | ORAL | Status: AC
  Administered 2020-01-06: 1 via ORAL
  Filled 2020-01-06: qty 1

## 2020-01-06 MED ORDER — OXYCODONE-ACETAMINOPHEN 5-325 MG PO TABS: 1.0000 | ORAL_TABLET | Freq: Four times a day (QID) | ORAL | 0 refills | Status: DC | PRN

## 2020-01-06 NOTE — Discharge Instructions (Addendum)
Please read and follow all provided instructions.  Your diagnoses today include:  1. Pelvic pain     Tests performed today include:  Blood counts and electrolytes  Blood tests to check liver and kidney function  Blood tests to check pancreas function  Urine test to look for infection and pregnancy (in women)  Vital signs. See below for your results today.   Medications prescribed:   Percocet (oxycodone/acetaminophen) - narcotic pain medication  DO NOT drive or perform any activities that require you to be awake and alert because this medicine can make you drowsy. BE VERY CAREFUL not to take multiple medicines containing Tylenol (also called acetaminophen). Doing so can lead to an overdose which can damage your liver and cause liver failure and possibly death.  Take any prescribed medications only as directed.  Home care instructions:   Follow any educational materials contained in this packet.  Follow-up instructions: Please follow-up with your primary care provider in the next 3 days for further evaluation of your symptoms.    Return instructions:  SEEK IMMEDIATE MEDICAL ATTENTION IF:  The pain does not go away or becomes severe   A temperature above 101F develops   Repeated vomiting occurs (multiple episodes)   The pain becomes localized to portions of the abdomen. The right side could possibly be appendicitis. In an adult, the left lower portion of the abdomen could be colitis or diverticulitis.   Blood is being passed in stools or vomit (bright red or black tarry stools)   You develop chest pain, difficulty breathing, dizziness or fainting, or become confused, poorly responsive, or inconsolable (young children)  If you have any other emergent concerns regarding your health  Additional Information: Abdominal (belly) pain can be caused by many things. Your caregiver performed an examination and possibly ordered blood/urine tests and imaging (CT scan, x-rays,  ultrasound). Many cases can be observed and treated at home after initial evaluation in the emergency department. Even though you are being discharged home, abdominal pain can be unpredictable. Therefore, you need a repeated exam if your pain does not resolve, returns, or worsens. Most patients with abdominal pain don't have to be admitted to the hospital or have surgery, but serious problems like appendicitis and gallbladder attacks can start out as nonspecific pain. Many abdominal conditions cannot be diagnosed in one visit, so follow-up evaluations are very important.  Your vital signs today were: BP 115/68   Pulse 95   Temp 98.3 F (36.8 C)   Resp 16   Ht 5\' 8"  (1.727 m)   Wt (!) 122.5 kg   SpO2 100%   BMI 41.05 kg/m  If your blood pressure (bp) was elevated above 135/85 this visit, please have this repeated by your doctor within one month. --------------

## 2020-01-06 NOTE — ED Notes (Signed)
Patient transported to Ultrasound 

## 2020-01-06 NOTE — ED Provider Notes (Signed)
[facility] EMERGENCY DEPARTMENT Provider Note   CSN: [id number] Arrival date & time: 01/05/20  1808     History Chief Complaint  Patient presents with  . Pelvic Pain    Renee Jensen is a 24 y.o. female.  Patient with history of left salpingo-oophorectomy performed early July 2021 for left ovarian mass --presents the emergency department with acute onset of recurrent left lower pelvic pain.  Patient states that the pain started 2 nights ago before she went to bed.  She went to work yesterday and the pain was gradually worsening.  She has had some vaginal bleeding/spotting which is improving.  She does have an IUD in place.  No nausea, vomiting or fever.  She denies urinary symptoms or vaginal discharge.  The onset of this condition was acute. The course is constant. Aggravating factors: none. Alleviating factors: none.          Past Medical History:  Diagnosis Date  . ADHD   . Anxiety   . Complication of anesthesia    "wakes up crying"  . Depression   . Hypothyroidism   . Ovarian cyst   . PONV (postoperative nausea and vomiting)    with some surgeries  . Pseudotumor cerebri    no recent symptoms, [facility] [place]  . Thyroid disease     Patient Active Problem List   Diagnosis Date Noted  . Pelvic pain 10/17/2019  . Missed abortion 10/25/2015    Past Surgical History:  Procedure Laterality Date  . ADENOIDECTOMY  1998  . DILATION AND EVACUATION N/A 06/27/2015   Procedure: DILATATION AND EVACUATION;  Surgeon: Bobbye Charleston, MD;  Location: [facility];  Service: Gynecology;  Laterality: N/A;  . DILATION AND EVACUATION N/A 10/25/2015   Procedure: DILATATION AND EVACUATION;  Surgeon: Bobbye Charleston, MD;  Location: [facility];  Service: Gynecology;  Laterality: N/A;  . LAPAROSCOPIC CHOLECYSTECTOMY  04/2014  . TONSILLECTOMY  2018  . TYMPANOSTOMY TUBE PLACEMENT  as child 52  . wisdome teeth extraction  2021  . XI ROBOTIC  ASSISTED OOPHORECTOMY Left 11/14/2019   Procedure: XI ROBOTIC ASSISTED OOPHORECTOMY;  Surgeon: Bobbye Charleston, MD;  Location: [facility];  Service: Gynecology;  Laterality: Left;  . XI ROBOTIC ASSISTED SALPINGECTOMY Left 11/14/2019   Procedure: XI ROBOTIC ASSISTED SALPINGECTOMY;  Surgeon: Bobbye Charleston, MD;  Location: [facility];  Service: Gynecology;  Laterality: Left;     OB History    Gravida  2   Para      Term      Preterm      AB  1   Living        SAB  1   TAB      Ectopic      Multiple      Live Births              Family History  Problem Relation Age of Onset  . Ulcerative colitis Mother   . Rheum arthritis Mother   . Fibromyalgia Mother   . Hypothyroidism Mother   . Kidney disease Mother   . Diabetes Father   . Hypertension Father   . Heart disease Father   . Hyperlipidemia Father     Social History   Tobacco Use  . Smoking status: Never Smoker  . Smokeless tobacco: Never Used  Vaping Use  . Vaping Use: Never used  Substance Use Topics  . Alcohol use: Yes  Comment: socially  . Drug use: No    Home Medications Prior to Admission medications   Medication Sig Start Date End Date Taking? Authorizing Provider  amphetamine-dextroamphetamine (ADDERALL XR) 25 MG 24 hr capsule Take 1 capsule by mouth every morning. 10/02/19  Yes Cirigliano, Mary K, DO  escitalopram (LEXAPRO) 20 MG tablet 1/2 tab po daily x 1 week then 1 tab po daily Patient taking differently: Take 20 mg by mouth daily. Take 1 tablet by mouth daily 12/20/19  Yes Cirigliano, Garvin Fila, DO  levothyroxine (SYNTHROID, LEVOTHROID) 112 MCG tablet Take 112 mcg by mouth daily before breakfast.   Yes [provider]  buPROPion (WELLBUTRIN XL) 300 MG 24 hr tablet Take 1 tablet (300 mg total) by mouth daily. Patient not taking: Reported on 12/20/2019 12/08/18   Ronnald Nian, DO  busPIRone (BUSPAR) 10 MG tablet Take 1 tablet (10 mg total) by mouth 3  (three) times daily. Patient not taking: Reported on 12/20/2019 06/20/19   Ronnald Nian, DO  promethazine (PHENERGAN) 12.5 MG tablet Take 1 tablet (12.5 mg total) by mouth every 6 (six) hours as needed for nausea or vomiting. Patient not taking: Reported on 01/06/2020 10/18/19   Jerelyn Charles, MD  traZODone (DESYREL) 50 MG tablet 1/2 to 1 tab po qHS PRN Patient not taking: Reported on 01/06/2020 07/06/19   Ronnald Nian, DO    Allergies    Benzodiazepines, Diazepam, and Alprazolam  Review of Systems   Review of Systems  Constitutional: Negative for fever.  HENT: Negative for rhinorrhea and sore throat.   Eyes: Negative for redness.  Respiratory: Negative for cough.   Cardiovascular: Negative for chest pain.  Gastrointestinal: Negative for abdominal pain, diarrhea, nausea and vomiting.  Genitourinary: Positive for pelvic pain and vaginal bleeding. Negative for dysuria, frequency and vaginal discharge.  Musculoskeletal: Negative for myalgias.  Skin: Negative for rash.  Neurological: Negative for headaches.    Physical Exam Updated Vital Signs BP (!) 132/85   Pulse 97   Temp 98.5 F (36.9 C) (Oral)   Resp 16   Ht 5\' 8"  (1.727 m)   Wt (!) 122.5 kg   SpO2 100%   BMI 41.05 kg/m   Physical Exam Vitals and nursing note reviewed. Exam conducted with a chaperone present.  Constitutional:      Appearance: She is well-developed.  HENT:     Head: Normocephalic and atraumatic.  Eyes:     General:        Right eye: No discharge.        Left eye: No discharge.     Conjunctiva/sclera: Conjunctivae normal.  Cardiovascular:     Rate and Rhythm: Normal rate and regular rhythm.     Heart sounds: Normal heart sounds.  Pulmonary:     Effort: Pulmonary effort is normal.     Breath sounds: Normal breath sounds.  Abdominal:     Palpations: Abdomen is soft.     Tenderness: There is no abdominal tenderness.  Genitourinary:    Exam position: Lithotomy position.     Labia:         Right: No lesion.        Left: No lesion.      Vagina: No vaginal discharge, tenderness or bleeding.     Cervix: No cervical motion tenderness or friability.     Uterus: Normal.      Adnexa:        Right: No tenderness or fullness.  Left: Tenderness (Left pelvis) present. No fullness.       Comments: Patient winces in pain with focal palpation in the left adnexa.  IUD strings protruding through cervix as expected. Musculoskeletal:     Cervical back: Normal range of motion and neck supple.  Skin:    General: Skin is warm and dry.  Neurological:     Mental Status: She is alert.     ED Results / Procedures / Treatments   Labs (all labs ordered are listed, but only abnormal results are displayed) Labs Reviewed  WET PREP, GENITAL - Abnormal; Notable for the following components:      Result Value   Clue Cells Wet Prep HPF POC PRESENT (*)    WBC, Wet Prep HPF POC MODERATE (*)    All other components within normal limits  URINALYSIS, COMPLETE (UACMP) WITH MICROSCOPIC - Abnormal; Notable for the following components:   Color, Urine STRAW (*)    Hgb urine dipstick MODERATE (*)    All other components within normal limits  CBC WITH DIFFERENTIAL/PLATELET  COMPREHENSIVE METABOLIC PANEL  I-STAT BETA HCG BLOOD, ED ([facility], [facility], [facility] ONLY)  GC/CHLAMYDIA PROBE AMP ([facility]) NOT AT [facility]    EKG None  Radiology US PELVIC COMPLETE WITH TRANSVAGINAL  Result Date: 01/06/2020 CLINICAL DATA:  Left pelvic pain since Friday night. History of left salpingo-oophorectomy EXAM: TRANSABDOMINAL AND TRANSVAGINAL ULTRASOUND OF PELVIS TECHNIQUE: Both transabdominal and transvaginal ultrasound examinations of the pelvis were performed. Transabdominal technique was performed for global imaging of the pelvis including uterus, ovaries, adnexal regions, and pelvic cul-de-sac. It was necessary to proceed with endovaginal exam following the transabdominal exam to visualize the endometrium and right ovary.  COMPARISON:  MRI of the pelvis 10/17/2019. FINDINGS: Uterus Measurements: 6.0 x 2.8 x 4.3 cm = volume: 37.5 mL. No fibroids or other mass visualized. Endometrium Thickness: 3 mm.  IUD is identified. Right ovary Measurements: 3.4 x 1.9 x 2.0 cm = volume: 6.6 mL. Normal appearance/no adnexal mass. Left ovary Surgically absent Other findings No abnormal free fluid. IMPRESSION: 1. No acute abnormality. 2. Status post left oophorectomy. 3. IUD. Electronically Signed   By: Kerby Moors M.D.   On: 01/06/2020 09:35    Procedures Procedures (including critical care time)  Medications Ordered in ED Medications  oxyCODONE-acetaminophen (PERCOCET/ROXICET) 5-325 MG per tablet 1 tablet (1 tablet Oral Given 01/06/20 1101)    ED Course  I have reviewed the triage vital signs and the nursing notes.  Pertinent labs & imaging results that were available during my care of the patient were reviewed by me and considered in my medical decision making (see chart for details).  Clinical Course as of Jan 06 1136  Sun Jan 06, 2020  0805 CO2: 24 [LS]    Clinical Course User Index [LS] Clare Charon, [place]   Patient seen and examined.  Lab work overall reassuring with normal white blood cell count.  UA without signs of infection.  Pelvic exam performed with RN chaperone.  Patient does have focal tenderness in the left pelvis on exam.  Given recent surgery, will proceed with ultrasonography for further evaluation.  Vital signs reviewed and are as follows: BP (!) 132/85   Pulse 97   Temp 98.5 F (36.9 C) (Oral)   Resp 16   Ht 5\' 8"  (1.727 m)   Wt (!) 122.5 kg   SpO2 100%   BMI 41.05 kg/m   11:36 AM Pelvic ultrasound was unrevealing.  Patient given Percocet for pain  control.  Plan for discharged home.  Encouraged GYN follow-up this coming week.  Patient verbalizes understanding agrees with plan.  The patient was urged to return to the Emergency Department immediately with worsening of current symptoms,  worsening abdominal pain, persistent vomiting, blood noted in stools, fever, or any other concerns. The patient verbalized understanding.   Patient counseled on use of narcotic pain medications. Counseled not to combine these medications with others containing tylenol. Urged not to drink alcohol, drive, or perform any other activities that requires focus while taking these medications. The patient verbalizes understanding and agrees with the plan.    MDM Rules/Calculators/A&P                          Pt with left-sided pelvic pain after recent surgery.  She is not pregnant.  Tenderness noted on pelvic exam.  Ultrasonography without any acute findings in the left lower quadrant.  Vitals are stable, no fever. Labs reassuring. No signs of dehydration, patient is tolerating PO's. Lungs are clear and no signs suggestive of PNA. Low concern for appendicitis, cholecystitis, pancreatitis, ruptured viscus, UTI, kidney stone, aortic dissection, aortic aneurysm or other emergent abdominal etiology. Supportive therapy indicated with return if symptoms worsen.    Final Clinical Impression(s) / ED Diagnoses Final diagnoses:  Pelvic pain   Rx / DC Orders ED Discharge Orders         Ordered    oxyCODONE-acetaminophen (PERCOCET/ROXICET) 5-325 MG tablet  Every 6 hours PRN     Discontinue  Reprint     01/06/20 1134           Carlisle Cater, PA-C 01/06/20 1139    Sherwood Gambler, MD 01/09/20 [phone]

## 2020-01-07 LAB — GC/CHLAMYDIA PROBE AMP (~~LOC~~) NOT AT ARMC
Chlamydia: NEGATIVE
Comment: NEGATIVE
Comment: NORMAL
Neisseria Gonorrhea: NEGATIVE

## 2020-02-13 ENCOUNTER — Other Ambulatory Visit: Payer: Self-pay

## 2020-02-13 ENCOUNTER — Emergency Department (HOSPITAL_COMMUNITY)
Admission: EM | Admit: 2020-02-13 | Discharge: 2020-02-14 | Disposition: A | Discharge status: Home / Self Care | Payer: BC Managed Care – PPO | Attending: Emergency Medicine | Admitting: Emergency Medicine

## 2020-02-13 ENCOUNTER — Encounter (HOSPITAL_COMMUNITY): Payer: Self-pay

## 2020-02-13 ENCOUNTER — Emergency Department (HOSPITAL_COMMUNITY): Payer: BC Managed Care – PPO

## 2020-02-13 DIAGNOSIS — M25512 Pain in left shoulder: Secondary | ICD-10-CM | POA: Diagnosis not present

## 2020-02-13 DIAGNOSIS — R0602 Shortness of breath: Secondary | ICD-10-CM | POA: Insufficient documentation

## 2020-02-13 DIAGNOSIS — R079 Chest pain, unspecified: Secondary | ICD-10-CM | POA: Diagnosis not present

## 2020-02-13 DIAGNOSIS — Z5321 Procedure and treatment not carried out due to patient leaving prior to being seen by health care provider: Secondary | ICD-10-CM | POA: Diagnosis not present

## 2020-02-13 LAB — CBC
HCT: 41.3 % (ref 36.0–46.0)
Hemoglobin: 13.6 g/dL (ref 12.0–15.0)
MCH: 29.6 pg (ref 26.0–34.0)
MCHC: 32.9 g/dL (ref 30.0–36.0)
MCV: 89.8 fL (ref 80.0–100.0)
Platelets: 317 10*3/uL (ref 150–400)
RBC: 4.6 MIL/uL (ref 3.87–5.11)
RDW: 11.9 % (ref 11.5–15.5)
WBC: 9.4 10*3/uL (ref 4.0–10.5)
nRBC: 0 % (ref 0.0–0.2)

## 2020-02-13 LAB — I-STAT BETA HCG BLOOD, ED (MC, WL, AP ONLY): I-stat hCG, quantitative: <5 m[IU]/mL (ref <5)

## 2020-02-13 LAB — BASIC METABOLIC PANEL
Anion gap: 10 (ref 5–15)
BUN: 12 mg/dL (ref 6–20)
CO2: 26 mmol/L (ref 22–32)
Calcium: 9.4 mg/dL (ref 8.9–10.3)
Chloride: 103 mmol/L (ref 98–111)
Creatinine, Ser: 1.09 mg/dL — ABNORMAL HIGH (ref 0.44–1.00)
GFR calc Af Amer: >60 mL/min (ref >60)
GFR calc non Af Amer: >60 mL/min (ref >60)
Glucose, Bld: 98 mg/dL (ref 70–99)
Potassium: 3.8 mmol/L (ref 3.5–5.1)
Sodium: 139 mmol/L (ref 135–145)

## 2020-02-13 LAB — D-DIMER, QUANTITATIVE: D-Dimer, Quant: <0.27 ug/mL-FEU (ref 0.00–0.50)

## 2020-02-13 NOTE — ED Triage Notes (Signed)
Pt arrives POV for eval of L sided chest/shoulder pain w/ associated SOB. Pt reports she started Oral contraception 3 weeks ago. Pt reports shortness of breath much worse today, tachy to 120s today at rest.

## 2020-02-14 LAB — TROPONIN I (HIGH SENSITIVITY): Troponin I (High Sensitivity): <2 ng/L (ref <18)

## 2020-04-30 ENCOUNTER — Other Ambulatory Visit: Payer: Self-pay | Admitting: Family Medicine

## 2020-04-30 DIAGNOSIS — F902 Attention-deficit hyperactivity disorder, combined type: Secondary | ICD-10-CM

## 2020-04-30 NOTE — Telephone Encounter (Signed)
Last VV 12/20/19 Last fill 10/02/19  #30/0

## 2021-01-19 ENCOUNTER — Inpatient Hospital Stay (HOSPITAL_COMMUNITY)
Admission: AD | Admit: 2021-01-19 | Discharge: 2021-01-19 | Disposition: A | Discharge status: Home / Self Care | Payer: BC Managed Care – PPO | Attending: Obstetrics & Gynecology | Admitting: Obstetrics & Gynecology

## 2021-01-19 ENCOUNTER — Encounter (HOSPITAL_COMMUNITY): Payer: Self-pay | Admitting: *Deleted

## 2021-01-19 ENCOUNTER — Inpatient Hospital Stay (HOSPITAL_COMMUNITY): Payer: BC Managed Care – PPO

## 2021-01-19 DIAGNOSIS — Z3A08 8 weeks gestation of pregnancy: Secondary | ICD-10-CM | POA: Insufficient documentation

## 2021-01-19 DIAGNOSIS — F321 Major depressive disorder, single episode, moderate: Secondary | ICD-10-CM | POA: Diagnosis present

## 2021-01-19 DIAGNOSIS — O209 Hemorrhage in early pregnancy, unspecified: Secondary | ICD-10-CM | POA: Insufficient documentation

## 2021-01-19 DIAGNOSIS — O4690 Antepartum hemorrhage, unspecified, unspecified trimester: Secondary | ICD-10-CM

## 2021-01-19 DIAGNOSIS — O4691 Antepartum hemorrhage, unspecified, first trimester: Secondary | ICD-10-CM

## 2021-01-19 DIAGNOSIS — Z3490 Encounter for supervision of normal pregnancy, unspecified, unspecified trimester: Secondary | ICD-10-CM

## 2021-01-19 HISTORY — DX: Other fracture of unspecified lower leg, initial encounter for closed fracture: S82.899A

## 2021-01-19 HISTORY — DX: Unspecified abnormal cytological findings in specimens from vagina: R87.629

## 2021-01-19 HISTORY — DX: Urinary tract infection, site not specified: N39.0

## 2021-01-19 LAB — URINALYSIS, ROUTINE W REFLEX MICROSCOPIC
Bilirubin Urine: NEGATIVE
Glucose, UA: NEGATIVE mg/dL
Hgb urine dipstick: NEGATIVE
Ketones, ur: NEGATIVE mg/dL
Leukocytes,Ua: NEGATIVE
Nitrite: NEGATIVE
Protein, ur: NEGATIVE mg/dL
Specific Gravity, Urine: 1.013 (ref 1.005–1.030)
pH: 7 (ref 5.0–8.0)

## 2021-01-19 LAB — POCT PREGNANCY, URINE: Preg Test, Ur: POSITIVE — AB

## 2021-01-19 NOTE — MAU Note (Signed)
When she went to the bathroom around noon, there was blood when she wiped, noted again the next time, but none the last time she went.  Hx of 2 miscarriages.  Has been to the office for the pregnancy,several hormone levels and [place].(Showing viable IUP).  Also reports hemorrhoids, hoping the blood was from that.

## 2021-01-19 NOTE — MAU Provider Note (Signed)
History     CSN: [id number]  Arrival date and time: 01/19/21 1438   Event Date/Time   First Provider Initiated Contact with Patient 01/19/21 1634      Chief Complaint  Patient presents with   Vaginal Bleeding   Abdominal Pain   HPI  Renee Jensen is a 25 y.o. female 60w0dhere in MAU with concerns about vaginal bleeding. She used the restroom around noon and saw a small amount of bright red blood when she wiped. She has hemorrhoids and was unsure if it was coming from her hemorrhoids or vagina.  She has a  Hx of miscarriage x 2.   She reports [place]  OB History     Gravida  3   Para      Term      Preterm      AB  2   Living         SAB  2   IAB      Ectopic      Multiple      Live Births              Past Medical History:  Diagnosis Date   ADHD    Ankle fracture    left x2, right x1   Anxiety    Complication of anesthesia    "wakes up crying"   Depression    Hypothyroidism    Ovarian cyst    PONV (postoperative nausea and vomiting)    with some surgeries   Pseudotumor cerebri    no recent symptoms, [place]   Thyroid disease    UTI (urinary tract infection)    Vaginal Pap smear, abnormal    ok since    Past Surgical History:  Procedure [place]ality Date   [place]   Procedure: DILATATION AND EVACUATION;  Surgeon: MBobbye Charleston MD;  [facility];  Service: Gynecology;  Laterality: N/A;   DILATION AND EVACUATION N/A 10/25/2015   Procedure: DILATATION AND EVACUATION;  Surgeon: MBobbye Charleston MD;  [facility];  Service: Gynecology;  Laterality: N/A;   LAPAROSCOPIC CHOLECYSTECTOMY  04/2014   TONSILLECTOMY  2018   TYMPANOSTOMY TUBE PLACEMENT  as child 1998   wisdome teeth extraction  2021   XI ROBOTIC ASSISTED OOPHORECTOMY Left 11/14/2019   Procedure: XI ROBOTIC  ASSISTED OOPHORECTOMY;  Surgeon: HBobbye Charleston MD;  [facility]  Service: Gynecology;  Laterality: Left;   XI ROBOTIC ASSISTED SALPINGECTOMY Left 11/14/2019   Procedure: XI ROBOTIC ASSISTED SALPINGECTOMY;  Surgeon: HBobbye Charleston MD;  [facility]  Service: [facility];  Laterality: Left;    Family History  Problem Relation Age of Onset   Ulcerative colitis Mother    Rheum arthritis Mother    Fibromyalgia Mother    Hypothyroidism Mother    Kidney disease Mother    Diabetes Father    Hypertension Father    Heart disease Father    Hyperlipidemia Father     Social History   Tobacco Use   Smoking status: Never   Smokeless tobacco: Never  Vaping Use   Vaping Use: Never used  Substance Use Topics   Alcohol use: Not Currently    Comment: socially   Drug use: No    Allergies:  Allergies  Allergen Reactions  Benzodiazepines Shortness Of Breath and Anxiety    Severe anxiety, crying, shortness of breath. Pt states they have opposite effect of what they are supposed to help with   Diazepam Shortness Of Breath, Anxiety and Other (See Comments)    Severe anxiety with all benzos Severe anxiety    Alprazolam Anxiety and Other (See Comments)    Has opposite effect with xanax Hysterical crying, unable to walk and talk      Medications Prior to Admission  Medication Sig Dispense Refill Last Dose   Prenatal Vit-Fe Fumarate-FA (PRENATAL VITAMIN PO) Take by mouth.   Past Week   amphetamine-dextroamphetamine (ADDERALL XR) 25 MG 24 hr capsule Take 1 capsule by mouth every morning. 30 capsule 0    escitalopram (LEXAPRO) 20 MG tablet 1/2 tab po daily x 1 week then 1 tab po daily (Patient taking differently: Take 20 mg by mouth daily. Take 1 tablet by mouth daily) 90 tablet 3    levothyroxine (SYNTHROID, LEVOTHROID) 112 MCG tablet Take 112 mcg by mouth daily before breakfast.      oxyCODONE-acetaminophen (PERCOCET/ROXICET) 5-325 MG  tablet Take 1 tablet by mouth every 6 (six) hours as needed for severe pain. 8 tablet 0    Results for orders placed or performed during the hospital encounter of 01/19/21 (from the past 48 hour(s))  Pregnancy, urine POC     Status: Abnormal   Collection Time: 01/19/21  3:05 PM  Result Value Ref Range   Preg Test, Ur POSITIVE (A) NEGATIVE    Comment:        THE SENSITIVITY OF THIS METHODOLOGY IS >24 mIU/mL   Urinalysis, Routine w reflex microscopic Urine, Clean Catch     Status: None   Collection Time: 01/19/21  4:08 PM  Result Value Ref Range   Color, Urine YELLOW YELLOW   APPearance CLEAR CLEAR   Specific Gravity, Urine 1.013 1.005 - 1.030   pH 7.0 5.0 - 8.0   Glucose, UA NEGATIVE NEGATIVE mg/dL   Hgb urine dipstick NEGATIVE NEGATIVE   Bilirubin Urine NEGATIVE NEGATIVE   Ketones, ur NEGATIVE NEGATIVE mg/dL   Protein, ur NEGATIVE NEGATIVE mg/dL   Nitrite NEGATIVE NEGATIVE   Leukocytes,Ua NEGATIVE NEGATIVE    Comment: Performed at [facility] [address] [place], [place] [address]    US OB LESS THAN 14 WEEKS WITH OB TRANSVAGINAL  Result Date: 01/19/2021 CLINICAL DATA:  Pregnant patient in first-trimester pregnancy with vaginal spotting today. Gestational age [redacted] weeks 0 days by outpatient ultrasound. EXAM: OBSTETRIC <14 WK [place] AND TRANSVAGINAL OB US TECHNIQUE: Both transabdominal and transvaginal ultrasound examinations were performed for complete evaluation of the gestation as well as the maternal uterus, adnexal regions, and pelvic cul-de-sac. Transvaginal technique was performed to assess early pregnancy. COMPARISON:  None. FINDINGS: Intrauterine gestational sac: Single Yolk sac:  Visualized. Embryo:  Visualized. Cardiac Activity: Visualized. Heart Rate: 174 bpm CRL:  16.2 mm   8 w   0 d                  [place] EDC: 08/31/2021 Subchorionic hemorrhage:  None visualized. Maternal uterus/adnexae: The left ovary is surgically absent. There is no left adnexal mass. There is a 3.6 cm  simple cyst within the right ovary. Right ovarian blood flow is seen. No right adnexal mass. No pelvic free fluid. IMPRESSION: 1. Single live intrauterine pregnancy estimated gestational age [redacted] weeks 0 days based on crown-rump length for ultrasound [facility] 08/31/2021. 2. No subchorionic hemorrhage. 3.  Simple cyst in the right ovary measures 3.6 cm. Ovarian blood flow is seen. Electronically Signed   By: Keith Rake M.D.   On: 01/19/2021 17:46     Review of Systems  Constitutional:  Negative for fever.  Gastrointestinal:  Negative for abdominal pain.  Genitourinary:  Positive for vaginal bleeding. Negative for vaginal discharge.  Physical Exam   Blood pressure 129/75, pulse 94, temperature 98.4 F (36.9 C), temperature source Oral, resp. rate 18, height '5\' 8"'$  (1.727 m), weight 126.9 kg, SpO2 100 %, unknown if currently breastfeeding.  Physical Exam Constitutional:      General: She is not in acute distress.    Appearance: She is well-developed. She is not ill-appearing, toxic-appearing or diaphoretic.  Abdominal:     Tenderness: There is no abdominal tenderness.  Genitourinary:    Comments: Vagina - Small amount of white vaginal discharge, no odor  Cervix - No contact bleeding, no active bleeding, cervix appears long and closed.  Bimanual exam: deferred  Chaperone present for exam.   Skin:    General: Skin is warm.  Neurological:     Mental Status: She is alert and oriented to person, place, and time.   MAU Course  Procedures  MDM  No vaginal blood noted on exam Viable fetus on [place].  A positive blood type.  Assessment and Plan   A:  1. Normal intrauterine pregnancy on prenatal ultrasound, antepartum   2. Vaginal bleeding during pregnancy   3. [redacted] weeks gestation of pregnancy     P:  Discharge home in stable condition Patient is reassured Return to MAU if symptoms worsen    Memory Heinrichs, Artist Pais, NP 01/19/2021 7:58 PM

## 2021-02-13 LAB — OB RESULTS CONSOLE ABO/RH: RH Type: POSITIVE

## 2021-02-13 LAB — OB RESULTS CONSOLE RPR: RPR: NONREACTIVE

## 2021-02-13 LAB — OB RESULTS CONSOLE HEPATITIS B SURFACE ANTIGEN: Hepatitis B Surface Ag: NEGATIVE

## 2021-02-13 LAB — OB RESULTS CONSOLE RUBELLA ANTIBODY, IGM: Rubella: IMMUNE

## 2021-02-13 LAB — OB RESULTS CONSOLE HIV ANTIBODY (ROUTINE TESTING): HIV: NONREACTIVE

## 2021-02-13 LAB — OB RESULTS CONSOLE GC/CHLAMYDIA
Chlamydia: NEGATIVE
Gonorrhea: NEGATIVE

## 2021-02-13 LAB — OB RESULTS CONSOLE ANTIBODY SCREEN: Antibody Screen: NEGATIVE

## 2021-02-13 LAB — HEPATITIS C ANTIBODY: HCV Ab: NEGATIVE

## 2021-04-03 ENCOUNTER — Other Ambulatory Visit: Payer: Self-pay

## 2021-04-03 ENCOUNTER — Other Ambulatory Visit: Payer: Self-pay | Admitting: Obstetrics and Gynecology

## 2021-04-03 DIAGNOSIS — Z363 Encounter for antenatal screening for malformations: Secondary | ICD-10-CM

## 2021-04-16 ENCOUNTER — Ambulatory Visit: Payer: BC Managed Care – PPO

## 2021-04-29 ENCOUNTER — Ambulatory Visit: Payer: BC Managed Care – PPO | Admitting: *Deleted

## 2021-04-29 ENCOUNTER — Encounter: Payer: Self-pay | Admitting: *Deleted

## 2021-04-29 ENCOUNTER — Ambulatory Visit: Payer: BC Managed Care – PPO | Attending: Obstetrics and Gynecology

## 2021-04-29 ENCOUNTER — Other Ambulatory Visit: Payer: Self-pay

## 2021-04-29 VITALS — BP 133/65 | HR 106

## 2021-04-29 DIAGNOSIS — Z363 Encounter for antenatal screening for malformations: Secondary | ICD-10-CM | POA: Diagnosis present

## 2021-04-29 DIAGNOSIS — O99282 Endocrine, nutritional and metabolic diseases complicating pregnancy, second trimester: Secondary | ICD-10-CM | POA: Diagnosis present

## 2021-04-29 DIAGNOSIS — E039 Hypothyroidism, unspecified: Secondary | ICD-10-CM | POA: Diagnosis present

## 2021-05-13 ENCOUNTER — Inpatient Hospital Stay (HOSPITAL_COMMUNITY)
Admission: AD | Admit: 2021-05-13 | Discharge: 2021-05-13 | Disposition: A | Discharge status: Home / Self Care | Payer: BC Managed Care – PPO | Attending: Obstetrics and Gynecology | Admitting: Obstetrics and Gynecology

## 2021-05-13 ENCOUNTER — Other Ambulatory Visit: Payer: Self-pay

## 2021-05-13 ENCOUNTER — Encounter (HOSPITAL_COMMUNITY): Payer: Self-pay | Admitting: Obstetrics and Gynecology

## 2021-05-13 DIAGNOSIS — R109 Unspecified abdominal pain: Secondary | ICD-10-CM | POA: Diagnosis not present

## 2021-05-13 DIAGNOSIS — O26892 Other specified pregnancy related conditions, second trimester: Secondary | ICD-10-CM

## 2021-05-13 DIAGNOSIS — Z7982 Long term (current) use of aspirin: Secondary | ICD-10-CM | POA: Insufficient documentation

## 2021-05-13 DIAGNOSIS — M549 Dorsalgia, unspecified: Secondary | ICD-10-CM | POA: Diagnosis present

## 2021-05-13 DIAGNOSIS — Z3A24 24 weeks gestation of pregnancy: Secondary | ICD-10-CM | POA: Insufficient documentation

## 2021-05-13 DIAGNOSIS — O99891 Other specified diseases and conditions complicating pregnancy: Secondary | ICD-10-CM

## 2021-05-13 LAB — URINALYSIS, ROUTINE W REFLEX MICROSCOPIC
Bilirubin Urine: NEGATIVE
Glucose, UA: NEGATIVE mg/dL
Hgb urine dipstick: NEGATIVE
Ketones, ur: NEGATIVE mg/dL
Leukocytes,Ua: NEGATIVE
Nitrite: NEGATIVE
Protein, ur: NEGATIVE mg/dL
Specific Gravity, Urine: 1.02 (ref 1.005–1.030)
pH: 6 (ref 5.0–8.0)

## 2021-05-13 NOTE — MAU Provider Note (Signed)
Chief Complaint:  Back Pain   Event Date/Time   First Provider Initiated Contact with Patient 05/13/21 2049      HPI: Renee Jensen is a 25 y.o. G3P0020 at [redacted]w[redacted]d who presents to maternity admissions reporting onset of severe back pain while sitting at work today, followed by intermittent cramping 5 x per hour. She called her OB and was told to come in and be evaluated.  She reports the pain is improved now but there is still some intermittent cramping.   She reports good fetal movement, denies LOF, vaginal bleeding, vaginal itching/burning, urinary symptoms, h/a, dizziness, n/v, or fever/chills.     Location: low back and low abdomen Quality: cramping Severity: 5/10 on pain scale Duration: 1 day Timing: constant back pain, intermittent abdominal pain Modifying factors: none Associated signs and symptoms: none  HPI  Past Medical History: Past Medical History:  Diagnosis Date   ADHD    Ankle fracture    left x2, right x1   Anxiety    Complication of anesthesia    "wakes up crying"   Depression    Hypothyroidism    Ovarian cyst    PONV (postoperative nausea and vomiting)    with some surgeries   Pseudotumor cerebri    no recent symptoms, [place]   Thyroid disease    UTI (urinary tract infection)    Vaginal Pap smear, abnormal    ok since    Past obstetric history: OB History  Gravida Para Term Preterm AB Living  3       2    SAB IAB Ectopic Multiple Live Births  2            # Outcome Date GA Lbr Len/2nd Weight Sex Delivery Anes PTL Lv  3 Current           2 SAB 10/2015          1 SAB 06/27/15            Past Surgical History: Past Surgical History:  Procedure Laterality Date   ADENOIDECTOMY  1998   DILATION AND CURETTAGE OF UTERUS     DILATION AND EVACUATION N/A 06/27/2015   Procedure: DILATATION AND EVACUATION;  Surgeon: Bobbye Charleston, MD;  Location: [facility];  Service: Gynecology;  Laterality: N/A;   DILATION AND  EVACUATION N/A 10/25/2015   Procedure: DILATATION AND EVACUATION;  Surgeon: Bobbye Charleston, MD;  Location: [facility];  Service: Gynecology;  Laterality: N/A;   LAPAROSCOPIC CHOLECYSTECTOMY  04/2014   TONSILLECTOMY  2018   TYMPANOSTOMY TUBE PLACEMENT  as child 1998   [place] teeth extraction  2021   XI ROBOTIC ASSISTED OOPHORECTOMY Left 11/14/2019   Procedure: XI ROBOTIC ASSISTED OOPHORECTOMY;  Surgeon: Bobbye Charleston, MD;  Location: [facility];  Service: Gynecology;  Laterality: Left;   XI ROBOTIC ASSISTED SALPINGECTOMY Left 11/14/2019   Procedure: XI ROBOTIC ASSISTED SALPINGECTOMY;  Surgeon: Bobbye Charleston, MD;  Location: [facility];  Service: Gynecology;  Laterality: Left;    Family History: Family History  Problem Relation Age of Onset   Ulcerative colitis Mother    Rheum arthritis Mother    Fibromyalgia Mother    Hypothyroidism Mother    Kidney disease Mother    Cancer Father    Diabetes Father    Hypertension Father    Heart disease Father    Hyperlipidemia Father     Social History: Social History   Tobacco Use  Smoking status: Never   Smokeless tobacco: Never  Vaping Use   Vaping Use: Never used  Substance Use Topics   Alcohol use: Not Currently    Comment: socially   Drug use: No    Allergies:  Allergies  Allergen Reactions   Benzodiazepines Shortness Of Breath and Anxiety    Severe anxiety, crying, shortness of breath. Pt states they have opposite effect of what they are supposed to help with   Diazepam Shortness Of Breath, Anxiety and Other (See Comments)    Severe anxiety with all benzos Severe anxiety    Alprazolam Anxiety and Other (See Comments)    Has opposite effect with xanax Hysterical crying, unable to walk and talk      Meds:  No medications prior to admission.    ROS:  Review of Systems   I have reviewed patient's Past Medical Hx, Surgical Hx, Family Hx, Social Hx, medications and allergies.    Physical Exam  Patient Vitals for the past 24 hrs:  BP Temp Temp src Pulse Resp SpO2 Height Weight  05/13/21 2108 110/63 97.8 F (36.6 C) Tympanic (!) 114 17 100 % -- --  05/13/21 2019 (!) 146/76 -- Oral (!) 101 16 100 % -- --  05/13/21 1946 128/76 -- -- -- -- -- -- --  05/13/21 1945 -- -- -- -- -- 100 % -- --  05/13/21 1941 -- 97.9 F (36.6 C) -- -- 17 -- 5\' 8"  (1.727 m) 128.8 kg   Constitutional: Well-developed, well-nourished female in no acute distress.  Cardiovascular: normal rate Respiratory: normal effort GI: Abd soft, non-tender, gravid appropriate for gestational age.  MS: Extremities nontender, no edema, normal ROM Neurologic: Alert and oriented x 4.  GU: Neg CVAT.  PELVIC EXAM: Cervix pink, visually closed, without lesion, scant white creamy discharge, vaginal walls and external genitalia normal Bimanual exam: Cervix 0/long/high, firm, anterior, neg CMT, uterus nontender, nonenlarged, adnexa without tenderness, enlargement, or mass  Dilation: Closed Effacement (%): Thick Exam by:: Fatima Blank, CNM  FHT:  Baseline 145 , moderate variability, accelerations present, no decelerations Contractions: None on toco or to palpation   Labs: Results for orders placed or performed during the hospital encounter of 05/13/21 (from the past 24 hour(s))  Urinalysis, Routine w reflex microscopic Urine, Clean Catch     Status: None   Collection Time: 05/13/21  8:14 PM  Result Value Ref Range   Color, Urine YELLOW YELLOW   APPearance CLEAR CLEAR   Specific Gravity, Urine 1.020 1.005 - 1.030   pH 6.0 5.0 - 8.0   Glucose, UA NEGATIVE NEGATIVE mg/dL   Hgb urine dipstick NEGATIVE NEGATIVE   Bilirubin Urine NEGATIVE NEGATIVE   Ketones, ur NEGATIVE NEGATIVE mg/dL   Protein, ur NEGATIVE NEGATIVE mg/dL   Nitrite NEGATIVE NEGATIVE   Leukocytes,Ua NEGATIVE NEGATIVE      Imaging:   MAU Course/MDM: Orders Placed This Encounter  Procedures   Culture, OB Urine   Urinalysis,  Routine w reflex microscopic Urine, Clean Catch   Discharge patient    No orders of the defined types were placed in this encounter.    NST reviewed and appropriate for gestational age Cervix closed/thick/high, so no evidence of preterm labor Urine sent for culture Rest/ice/heat/warm bath/increase PO fluids/Tylenol/pregnancy support belt for pain F/U in office as scheduled Return to MAU as needed for emergencies   Assessment: 1. [redacted] weeks gestation of pregnancy   2. Back pain affecting pregnancy in second trimester   3. Abdominal pain during  pregnancy in second trimester     Plan: Discharge home Labor precautions and fetal kick counts  Follow-up Information     [facility] Follow up.   Why: As scheduled Contact information: [place] [address] [phone]         [facility] Follow up.   Specialty: Obstetrics and Gynecology Why: As needed for emergencies Contact information: [address] [id number] [place] [phone]               Allergies as of 05/13/2021       Reactions   Benzodiazepines Shortness Of Breath, Anxiety   Severe anxiety, crying, shortness of breath. Pt states they have opposite effect of what they are supposed to help with   Diazepam Shortness Of Breath, Anxiety, Other (See Comments)   Severe anxiety with all benzos Severe anxiety   Alprazolam Anxiety, Other (See Comments)   Has opposite effect with xanax Hysterical crying, unable to walk and talk        Medication List     TAKE these medications    aspirin EC 81 MG tablet Take 81 mg by mouth daily. Swallow whole.   escitalopram 20 MG tablet Commonly known as: Lexapro 1/2 tab po daily x 1 week then 1 tab po daily What changed:  how much to take how to take this when to take this additional instructions   famotidine 20 MG tablet Commonly known as: PEPCID Take 20 mg by  mouth 2 (two) times daily.   levothyroxine 112 MCG tablet Commonly known as: SYNTHROID Take 112 mcg by mouth daily before breakfast.   PRENATAL VITAMIN PO Take by mouth.   thyroid 60 MG tablet Commonly known as: ARMOUR Take 60 mg by mouth daily before breakfast.        Fatima Blank Certified Nurse-Midwife 05/13/2021 10:09 PM

## 2021-05-13 NOTE — MAU Note (Signed)
Was at work and started having lower back pain which is not new. However started having lower abd cramping and then got nauseated. Called and was told to come in. Denies VB or d/c. Good FM. Pt reports hx of baby measuring alittle small. Had L tube and ovary removed in June due to large "tumor".

## 2021-06-14 NOTE — L&D Delivery Note (Signed)
Patient was C/C/+1 and pushed for 2 hours 30 minutes with epidural.    ?NSVD  female infant, Apgars 5,8, weight P.   ?The patient had a first degree midline perineal  laceration repaired with 2-0 vicryl R. ?Fundus was firm. ?EBL was expected amount. ?Placenta was delivered intact. ?Vagina was clear. ? ?Delayed cord clamping was not done- baby not vigorous at first, handed to nurses for stimulation and then was vigorous and doing skin to skin with mother. ? ?Renee Jensen  ?

## 2021-06-19 ENCOUNTER — Inpatient Hospital Stay (HOSPITAL_COMMUNITY)
Admission: AD | Admit: 2021-06-19 | Discharge: 2021-06-19 | Disposition: A | Discharge status: Home / Self Care | Payer: Medicaid Other | Attending: Obstetrics and Gynecology | Admitting: Obstetrics and Gynecology

## 2021-06-19 ENCOUNTER — Other Ambulatory Visit: Payer: Self-pay

## 2021-06-19 ENCOUNTER — Encounter (HOSPITAL_COMMUNITY): Payer: Self-pay | Admitting: Obstetrics and Gynecology

## 2021-06-19 DIAGNOSIS — M545 Low back pain, unspecified: Secondary | ICD-10-CM | POA: Insufficient documentation

## 2021-06-19 DIAGNOSIS — O99353 Diseases of the nervous system complicating pregnancy, third trimester: Secondary | ICD-10-CM | POA: Insufficient documentation

## 2021-06-19 DIAGNOSIS — R102 Pelvic and perineal pain: Secondary | ICD-10-CM | POA: Insufficient documentation

## 2021-06-19 DIAGNOSIS — R1033 Periumbilical pain: Secondary | ICD-10-CM

## 2021-06-19 DIAGNOSIS — O26893 Other specified pregnancy related conditions, third trimester: Secondary | ICD-10-CM | POA: Diagnosis not present

## 2021-06-19 DIAGNOSIS — O99613 Diseases of the digestive system complicating pregnancy, third trimester: Secondary | ICD-10-CM | POA: Diagnosis not present

## 2021-06-19 DIAGNOSIS — Z3A30 30 weeks gestation of pregnancy: Secondary | ICD-10-CM | POA: Diagnosis not present

## 2021-06-19 DIAGNOSIS — G8929 Other chronic pain: Secondary | ICD-10-CM | POA: Insufficient documentation

## 2021-06-19 DIAGNOSIS — K219 Gastro-esophageal reflux disease without esophagitis: Secondary | ICD-10-CM | POA: Diagnosis not present

## 2021-06-19 DIAGNOSIS — Z3689 Encounter for other specified antenatal screening: Secondary | ICD-10-CM

## 2021-06-19 HISTORY — DX: Headache, unspecified: R51.9

## 2021-06-19 LAB — URINALYSIS, ROUTINE W REFLEX MICROSCOPIC
Bilirubin Urine: NEGATIVE
Glucose, UA: NEGATIVE mg/dL
Hgb urine dipstick: NEGATIVE
Ketones, ur: NEGATIVE mg/dL
Leukocytes,Ua: NEGATIVE
Nitrite: NEGATIVE
Protein, ur: NEGATIVE mg/dL
Specific Gravity, Urine: >1.03 — ABNORMAL HIGH (ref 1.005–1.030)
pH: 6 (ref 5.0–8.0)

## 2021-06-19 LAB — WET PREP, GENITAL
Clue Cells Wet Prep HPF POC: NONE SEEN
Sperm: NONE SEEN
Trich, Wet Prep: NONE SEEN
WBC, Wet Prep HPF POC: >=10 — AB (ref <10)
Yeast Wet Prep HPF POC: NONE SEEN

## 2021-06-19 MED ORDER — ALUM & MAG HYDROXIDE-SIMETH 200-200-20 MG/5ML PO SUSP
30.0000 mL | Freq: Once | ORAL | Status: AC
  Administered 2021-06-19: 30 mL via ORAL
  Filled 2021-06-19: qty 30

## 2021-06-19 MED ORDER — ACETAMINOPHEN 500 MG PO TABS
1000.0000 mg | ORAL_TABLET | Freq: Once | ORAL | Status: AC
  Administered 2021-06-19: 1000 mg via ORAL
  Filled 2021-06-19: qty 2

## 2021-06-19 MED ORDER — CYCLOBENZAPRINE HCL 10 MG PO TABS: 10.0000 mg | ORAL_TABLET | Freq: Two times a day (BID) | ORAL | 0 refills | Status: DC | PRN

## 2021-06-19 MED ORDER — CYCLOBENZAPRINE HCL 5 MG PO TABS
10.0000 mg | ORAL_TABLET | Freq: Once | ORAL | Status: AC
  Administered 2021-06-19: 10 mg via ORAL
  Filled 2021-06-19: qty 2

## 2021-06-19 MED ORDER — LACTATED RINGERS IV BOLUS
1000.0000 mL | Freq: Once | INTRAVENOUS | Status: AC
  Administered 2021-06-19: 1000 mL via INTRAVENOUS

## 2021-06-19 MED ORDER — LIDOCAINE VISCOUS HCL 2 % MT SOLN: 15.0000 mL | Freq: Once | OROMUCOSAL | Status: DC

## 2021-06-19 NOTE — MAU Provider Note (Signed)
History     CSN: [id number]  Arrival date and time: 06/19/21 1710   Event Date/Time   First Provider Initiated Contact with Patient 06/19/21 1803      Chief Complaint  Patient presents with   Contractions   HPI Renee Jensen is a 26 y.o. G3P0020 at [redacted]w[redacted]d who presents to MAU with chief complaint of contractions. Onset 0300 this morning. Her pain is predominantly periumbilical. She has lesser pain sites across the top of her abdomen and in her suprapubic area. She denies aggravating or alleviating factors. She has not recently taken medication or tried other treatments for this complaint. She has acid reflux but her symptoms do not typically manifest in this way and she denies recent dietary triggers. She denies vaginal bleeding, leaking of fluid, decreased fetal movement, fever, falls, or recent illness. She is remote from sexual intercourse.  Patient also c/o chronic pelvic and low back pain s/p "multiple bulging discs". She has received a referral to physical therapy but lives and works in [place], [place]. She states that she has tried to find a PT practice that accommodates her work schedule but has been unsuccessful. She has been told "just tough it out".  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 She receives care with [facility].  OB History     Gravida  3   Para      Term      Preterm      AB  2   Living         SAB  2   IAB      Ectopic      Multiple      Live Births              Past Medical History:  Diagnosis Date   ADHD    Ankle fracture    left x2, right x1   Anxiety    Complication of anesthesia    "wakes up crying"   Depression    Headache    Hypothyroidism    Ovarian cyst    PONV (postoperative nausea and vomiting)    with some surgeries   Pseudotumor cerebri    no recent symptoms, [place] [facility] [place]   Thyroid disease    UTI (urinary tract infection)    Vaginal Pap smear, abnormal    ok since    Past  Surgical History:  Procedure Laterality Date   [place] N/A 06/27/2015   Procedure: DILATATION AND EVACUATION;  Surgeon: Bobbye Charleston, MD;  Location: [facility];  Service: Gynecology;  Laterality: N/A;   DILATION AND EVACUATION N/A 10/25/2015   Procedure: DILATATION AND EVACUATION;  Surgeon: Bobbye Charleston, MD;  Location: [facility];  Service: Gynecology;  Laterality: N/A;   LAPAROSCOPIC CHOLECYSTECTOMY  04/2014   TONSILLECTOMY  2018   TYMPANOSTOMY TUBE PLACEMENT  as child 1998   [place] teeth extraction  2021   XI ROBOTIC ASSISTED OOPHORECTOMY Left 11/14/2019   Procedure: XI ROBOTIC ASSISTED OOPHORECTOMY;  Surgeon: Bobbye Charleston, MD;  Location: [facility];  Service: Gynecology;  Laterality: Left;   XI ROBOTIC ASSISTED SALPINGECTOMY Left 11/14/2019   Procedure: XI ROBOTIC ASSISTED SALPINGECTOMY;  Surgeon: Bobbye Charleston, MD;  Location: [facility];  Service: Gynecology;  Laterality: Left;    Family History  Problem Relation Age of Onset   Kidney disease Mother  tumor removed, never test for cancer, kidney removed also   Ulcerative colitis Mother    Rheum arthritis Mother    Fibromyalgia Mother    Hypothyroidism Mother    Cancer Father        prostate   Diabetes Father    Hypertension Father    Heart disease Father    Hyperlipidemia Father     Social History   Tobacco Use   Smoking status: Never   Smokeless tobacco: Never  Vaping Use   Vaping Use: Never used  Substance Use Topics   Alcohol use: Not Currently    Comment: socially   Drug use: No    Allergies:  Allergies  Allergen Reactions   Benzodiazepines Shortness Of Breath and Anxiety    Severe anxiety, crying, shortness of breath. Pt states they have opposite effect of what they are supposed to help with   Diazepam Shortness Of Breath, Anxiety and Other (See Comments)    Severe anxiety with all  benzos Severe anxiety    Alprazolam Anxiety and Other (See Comments)    Has opposite effect with xanax Hysterical crying, unable to walk and talk      Medications Prior to Admission  Medication Sig Dispense Refill Last Dose   aspirin EC 81 MG tablet Take 81 mg by mouth daily. Swallow whole.   06/19/2021   famotidine (PEPCID) 20 MG tablet Take 20 mg by mouth 2 (two) times daily.   06/19/2021   levothyroxine (SYNTHROID, LEVOTHROID) 112 MCG tablet Take 112 mcg by mouth daily before breakfast.   06/19/2021   Prenatal Vit-Fe Fumarate-FA (PRENATAL VITAMIN PO) Take by mouth.   06/19/2021   escitalopram (LEXAPRO) 20 MG tablet 1/2 tab po daily x 1 week then 1 tab po daily (Patient taking differently: Take 20 mg by mouth daily. Take 1 tablet by mouth daily) 90 tablet 3    thyroid (ARMOUR) 60 MG tablet Take 60 mg by mouth daily before breakfast.       Review of Systems  Gastrointestinal:  Positive for abdominal pain.  Genitourinary:  Positive for pelvic pain.  Musculoskeletal:  Positive for back pain.  All other systems reviewed and are negative. Physical Exam   Blood pressure 127/70, pulse (!) 114, temperature 97.9 F (36.6 C), temperature source Oral, resp. rate 18, height 5\' 8"  (1.727 m), weight 127.8 kg, last menstrual period 11/19/2020, SpO2 99 %, unknown if currently breastfeeding.  Physical Exam Vitals and nursing note reviewed. Exam conducted with a chaperone present.  Cardiovascular:     Rate and Rhythm: Regular rhythm. Tachycardia present.     Pulses: Normal pulses.     Heart sounds: Normal heart sounds.  Pulmonary:     Effort: Pulmonary effort is normal.     Breath sounds: Normal breath sounds.  Abdominal:     Comments: Gravid  Genitourinary:    Comments: Pelvic exam: External genitalia normal, vaginal walls pink and well rugated, cervix visually closed, no lesions noted. No abnormal discharge noted.   Skin:    General: Skin is warm and dry.     Capillary Refill: Capillary  refill takes less than 2 seconds.  Neurological:     Mental Status: She is alert and oriented to person, place, and time.    MAU Course  Procedures  BP 113/63 (BP Location: Right Arm)    Pulse 92    Temp 97.9 F (36.6 C) (Oral)    Resp 18    Ht 5\' 8"  (1.727 m)  Wt 127.8 kg    LMP 11/19/2020    SpO2 99%    BMI 42.83 kg/m    --Reactive tracing: baseline 140, mod var, + accels, no decels --[place]: quiet  Orders Placed This Encounter  Procedures   Wet prep, genital   Urinalysis, Routine w reflex microscopic Urine, Clean Catch   Discharge patient   Results for orders placed or performed during the hospital encounter of 06/19/21 (from the past 72 hour(s))  Urinalysis, Routine w reflex microscopic Urine, Clean Catch     Status: Abnormal   Collection Time: 06/19/21  6:07 PM  Result Value Ref Range   Color, Urine YELLOW YELLOW   APPearance CLEAR CLEAR   Specific Gravity, Urine >1.030 (H) 1.005 - 1.030   pH 6.0 5.0 - 8.0   Glucose, UA NEGATIVE NEGATIVE mg/dL   Hgb urine dipstick NEGATIVE NEGATIVE   Bilirubin Urine NEGATIVE NEGATIVE   Ketones, ur NEGATIVE NEGATIVE mg/dL   Protein, ur NEGATIVE NEGATIVE mg/dL   Nitrite NEGATIVE NEGATIVE   Leukocytes,Ua NEGATIVE NEGATIVE    Comment: Microscopic not done on urines with negative protein, blood, leukocytes, nitrite, or glucose < 500 mg/dL. Performed at [facility], [place] [address], [place], [place] [address]   Wet prep, genital     Status: Abnormal   Collection Time: 06/19/21  6:21 PM   Specimen: Vaginal  Result Value Ref Range   Yeast Wet Prep HPF POC NONE SEEN NONE SEEN   Trich, Wet Prep NONE SEEN NONE SEEN   Clue Cells Wet Prep HPF POC NONE SEEN NONE SEEN   WBC, Wet Prep HPF POC >=10 (A) <10   Sperm NONE SEEN     Comment: Performed at [facility], [place] [address] [place], [place] [address]    Meds ordered this encounter  Medications   acetaminophen (TYLENOL) tablet 1,000 mg   cyclobenzaprine (FLEXERIL) tablet 10  mg   lactated ringers bolus 1,000 mL   alum & mag hydroxide-simeth (MAALOX/MYLANTA) 200-200-20 MG/5ML suspension 30 mL   DISCONTD: lidocaine (XYLOCAINE) 2 % viscous mouth solution 15 mL   cyclobenzaprine (FLEXERIL) 10 MG tablet    Sig: Take 1 tablet (10 mg total) by mouth 2 (two) times daily as needed for muscle spasms.    Dispense:  20 tablet    Refill:  0   Assessment and Plan  --26 y.o. G3P0020 at [redacted]w[redacted]d  --Reactive tracing --Closed cervix --Abdominal pain resolved with MAU interventions --[facility] PT needs with outpatient care team --Patient declines updated accommodations letter --Discharge home in stable condition  Darlina Rumpf, [place], MSN, CNM

## 2021-06-19 NOTE — MAU Note (Signed)
Presents with c/o intermittent ctxs since 0300 this morning.  Denies VB or LOF.  Endorses +FM.  Denies recent intercourse

## 2021-06-19 NOTE — Discharge Instructions (Signed)

## 2021-06-22 LAB — GC/CHLAMYDIA PROBE AMP (~~LOC~~) NOT AT ARMC
Chlamydia: NEGATIVE
Comment: NEGATIVE
Comment: NORMAL
Neisseria Gonorrhea: NEGATIVE

## 2021-07-07 ENCOUNTER — Other Ambulatory Visit: Payer: Self-pay

## 2021-07-07 ENCOUNTER — Encounter (HOSPITAL_COMMUNITY): Payer: Self-pay | Admitting: Obstetrics and Gynecology

## 2021-07-07 ENCOUNTER — Inpatient Hospital Stay (HOSPITAL_COMMUNITY): Payer: PRIVATE HEALTH INSURANCE

## 2021-07-07 ENCOUNTER — Inpatient Hospital Stay (HOSPITAL_COMMUNITY)
Admission: AD | Admit: 2021-07-07 | Discharge: 2021-07-07 | Disposition: A | Discharge status: Home / Self Care | Payer: PRIVATE HEALTH INSURANCE | Attending: Obstetrics and Gynecology | Admitting: Obstetrics and Gynecology

## 2021-07-07 DIAGNOSIS — R0602 Shortness of breath: Secondary | ICD-10-CM | POA: Diagnosis present

## 2021-07-07 DIAGNOSIS — Z3A32 32 weeks gestation of pregnancy: Secondary | ICD-10-CM | POA: Insufficient documentation

## 2021-07-07 DIAGNOSIS — O26893 Other specified pregnancy related conditions, third trimester: Secondary | ICD-10-CM | POA: Insufficient documentation

## 2021-07-07 DIAGNOSIS — O2693 Pregnancy related conditions, unspecified, third trimester: Secondary | ICD-10-CM

## 2021-07-07 LAB — CBC
HCT: 33.7 % — ABNORMAL LOW (ref 36.0–46.0)
Hemoglobin: 11.4 g/dL — ABNORMAL LOW (ref 12.0–15.0)
MCH: 29.4 pg (ref 26.0–34.0)
MCHC: 33.8 g/dL (ref 30.0–36.0)
MCV: 86.9 fL (ref 80.0–100.0)
Platelets: 202 10*3/uL (ref 150–400)
RBC: 3.88 MIL/uL (ref 3.87–5.11)
RDW: 13.5 % (ref 11.5–15.5)
WBC: 10.4 10*3/uL (ref 4.0–10.5)
nRBC: 0 % (ref 0.0–0.2)

## 2021-07-07 LAB — COMPREHENSIVE METABOLIC PANEL
ALT: 17 U/L (ref 0–44)
AST: 13 U/L — ABNORMAL LOW (ref 15–41)
Albumin: 2.8 g/dL — ABNORMAL LOW (ref 3.5–5.0)
Alkaline Phosphatase: 72 U/L (ref 38–126)
Anion gap: 9 (ref 5–15)
BUN: 6 mg/dL (ref 6–20)
CO2: 21 mmol/L — ABNORMAL LOW (ref 22–32)
Calcium: 9 mg/dL (ref 8.9–10.3)
Chloride: 105 mmol/L (ref 98–111)
Creatinine, Ser: 0.68 mg/dL (ref 0.44–1.00)
GFR, Estimated: >60 mL/min (ref >60)
Glucose, Bld: 82 mg/dL (ref 70–99)
Potassium: 3.6 mmol/L (ref 3.5–5.1)
Sodium: 135 mmol/L (ref 135–145)
Total Bilirubin: 0.3 mg/dL (ref 0.3–1.2)
Total Protein: 6.3 g/dL — ABNORMAL LOW (ref 6.5–8.1)

## 2021-07-07 LAB — D-DIMER, QUANTITATIVE: D-Dimer, Quant: 0.8 ug/mL-FEU — ABNORMAL HIGH (ref 0.00–0.50)

## 2021-07-07 LAB — URINALYSIS, ROUTINE W REFLEX MICROSCOPIC
Bilirubin Urine: NEGATIVE
Glucose, UA: NEGATIVE mg/dL
Hgb urine dipstick: NEGATIVE
Ketones, ur: NEGATIVE mg/dL
Leukocytes,Ua: NEGATIVE
Nitrite: NEGATIVE
Protein, ur: NEGATIVE mg/dL
Specific Gravity, Urine: 1.004 — ABNORMAL LOW (ref 1.005–1.030)
pH: 6 (ref 5.0–8.0)

## 2021-07-07 LAB — TROPONIN I (HIGH SENSITIVITY): Troponin I (High Sensitivity): 3 ng/L (ref <18)

## 2021-07-07 NOTE — MAU Provider Note (Signed)
History     CSN: [id number]  Arrival date and time: 07/07/21 1349   None     Chief Complaint  Patient presents with   Shortness of Breath   HPI Renee Jensen is a 26 y.o. G3P0020 at [redacted]w[redacted]d who presents to MAU for shortness of breath. Patient reports that around 0230-0300 she woke up with nasal drainage and thought she was getting a sinus infection. This morning while at work she started feeling short of breath and contacted her PCP. PCP was able to work her in and did strep, Covid, and Flu swabs, all of which were negative. She reports that he told her "she wasn't moving air into her lungs" and gave her an prescription for an albuterol inhaler, which she reports she took 2 puffs. About 20 minutes later, she took another 2 puffs because she felt as if it had not helped. She reports that she also informed her OBGYN who she reports told her to come in for PE workup. She denies cough, congestion, sore throat, fever. Denies CP but reports a mild intermittent pressure in mid-chest. She denies palpitations, history of blood clots, cardiac history or asthma. No headaches, vision changes, RUQ/epigastric pain, contractions, leaking fluid, or vaginal bleeding. Endorses active fetal movement.   OB History     Gravida  3   Para      Term      Preterm      AB  2   Living         SAB  2   IAB      Ectopic      Multiple      Live Births              Past Medical History:  Diagnosis Date   ADHD    Ankle fracture    left x2, right x1   Anxiety    Complication of anesthesia    "wakes up crying"   Depression    Headache    Hypothyroidism    Ovarian cyst    PONV (postoperative nausea and vomiting)    with some surgeries   Pseudotumor cerebri    no recent symptoms, [facility] [place]   Thyroid disease    UTI (urinary tract infection)    Vaginal Pap smear, abnormal    ok since    Past Surgical History:  Procedure Laterality Date   [place] N/A 06/27/2015   Procedure: DILATATION AND EVACUATION;  Surgeon: Bobbye Charleston, MD;  Location: [facility];  Service: Gynecology;  Laterality: N/A;   DILATION AND EVACUATION N/A 10/25/2015   Procedure: DILATATION AND EVACUATION;  Surgeon: Bobbye Charleston, MD;  Location: [facility];  Service: Gynecology;  Laterality: N/A;   LAPAROSCOPIC CHOLECYSTECTOMY  04/2014   TONSILLECTOMY  2018   TYMPANOSTOMY TUBE PLACEMENT  as child 1998   [place] teeth extraction  2021   XI ROBOTIC ASSISTED OOPHORECTOMY Left 11/14/2019   Procedure: XI ROBOTIC ASSISTED OOPHORECTOMY;  Surgeon: Bobbye Charleston, MD;  Location: [facility];  Service: Gynecology;  Laterality: Left;   XI ROBOTIC ASSISTED SALPINGECTOMY Left 11/14/2019   Procedure: XI ROBOTIC ASSISTED SALPINGECTOMY;  Surgeon: Bobbye Charleston, MD;  Location: [facility];  Service: Gynecology;  Laterality: Left;    Family History  Problem Relation Age of Onset   Kidney disease Mother  tumor removed, never test for cancer, kidney removed also   Ulcerative colitis Mother    Rheum arthritis Mother    Fibromyalgia Mother    Hypothyroidism Mother    Cancer Father        prostate   Diabetes Father    Hypertension Father    Heart disease Father    Hyperlipidemia Father     Social History   Tobacco Use   Smoking status: Never   Smokeless tobacco: Never  Vaping Use   Vaping Use: Never used  Substance Use Topics   Alcohol use: Not Currently    Comment: socially   Drug use: No    Allergies:  Allergies  Allergen Reactions   Benzodiazepines Shortness Of Breath and Anxiety    Severe anxiety, crying, shortness of breath. Pt states they have opposite effect of what they are supposed to help with   Diazepam Shortness Of Breath, Anxiety and Other (See Comments)    Severe anxiety with all benzos Severe anxiety    Alprazolam Anxiety and Other (See  Comments)    Has opposite effect with xanax Hysterical crying, unable to walk and talk      No medications prior to admission.    Review of Systems  Constitutional: Negative.   HENT:  Positive for postnasal drip. Negative for congestion and sore throat.   Respiratory:  Positive for chest tightness and shortness of breath. Negative for cough.   Cardiovascular: Negative.   Gastrointestinal: Negative.   Genitourinary: Negative.   Musculoskeletal: Negative.   Neurological: Negative.    Physical Exam   Patient Vitals for the past 24 hrs:  BP Temp Pulse Resp SpO2  07/07/21 1751 -- -- -- 16 --  07/07/21 1715 (!) 112/50 -- (!) 104 -- --  07/07/21 1605 -- -- -- -- 100 %  07/07/21 1520 -- -- -- -- 99 %  07/07/21 1435 -- -- -- -- 99 %  07/07/21 1430 -- -- -- -- 100 %  07/07/21 1405 135/69 -- (!) 116 -- --  07/07/21 1403 -- 98.2 F (36.8 C) -- (!) 22 100 %    Physical Exam Vitals and nursing note reviewed.  Constitutional:      General: She is not in acute distress.    Appearance: She is obese.  HENT:     Head: Normocephalic.  Eyes:     Extraocular Movements: Extraocular movements intact.     Pupils: Pupils are equal, round, and reactive to light.  Cardiovascular:     Rate and Rhythm: Regular rhythm. Tachycardia present.     Heart sounds: No murmur heard. Pulmonary:     Effort: Pulmonary effort is normal. No respiratory distress.     Breath sounds: Normal breath sounds. No stridor. No decreased breath sounds or wheezing.  Abdominal:     Palpations: Abdomen is soft.     Tenderness: There is no abdominal tenderness.     Comments: gravid  Musculoskeletal:        General: Normal range of motion.     Cervical back: Normal range of motion.  Skin:    General: Skin is warm and dry.  Neurological:     General: No focal deficit present.     Mental Status: She is alert and oriented to person, place, and time.  Psychiatric:        Mood and Affect: Mood normal.         Behavior: Behavior normal.   NST FHR: 135 bpm, moderate variability, +15x15 accels,  no decels Toco: quiet  DG Chest Portable 1 View  Result Date: 07/07/2021 CLINICAL DATA:  Shortness of breath. EXAM: PORTABLE CHEST 1 VIEW COMPARISON:  02/13/2020 FINDINGS: Single-view of the chest demonstrates clear lungs. Heart and mediastinum are within normal limits. Trachea is midline. Negative for a pneumothorax. No acute bone abnormality. IMPRESSION: No active disease. Electronically Signed   By: Markus Daft M.D.   On: 07/07/2021 15:08    MAU Course  Procedures UA CBC, CMP, D-Dimer, Troponin EKG- reviewed with Dr. Nehemiah Settle Chest x-ray  MDM VSS, O2 sats 99-100%, no signs of respiratory distress EKG shows sinus tachycardia-reviewed with Dr. Nehemiah Settle  Chest x-ray negative CBC and CMP reassuring.  D/w Dr. Nehemiah Settle and if D-dimer is less than 1.7 patient is at low risk for PE, PE unlikely and CT not indicated. I reviewed with patient that if d-dimer is okay, CT not needed and she is okay with not proceeding with CT.  Troponin negative, D-dimer 0.8. Results were reviewed with patient and her mother and patient continues to be okay with not proceeding with CT. She reports that she feels like she is "getting sick with a cold or is having sinus issues." She no longer reports SOB, just sinus congestion and a "burning, sick-feeling sensation" that she gets when she starts to develop a cold. She is requesting a list of safe medications that she can use during pregnancy. At this time I have a low suspicion for PE, pneumonia or acute ACS. Other differentials include asthma, bronchitis, or other viral illness. I d/w patient and her mother strict return precautions. If cold/viral-like symptoms persist, may retest for Covid and Flu. She may also continue using albuterol inhaler prn. List of safe meds in pregnancy provided to patient  Assessment and Plan  [redacted] weeks gestation of pregnancy Shortness of breath  - Discharge  home in stable condition - Strict return precautions reviewed at length with both patient and her mother. Return to MAU sooner or as needed for worsening symptoms - Keep OB appointment as scheduled   Renee Harder, CNM 07/07/2021, 8:14 PM

## 2021-07-07 NOTE — MAU Note (Signed)
Pt reports at 0200-0300 she felt like she had some drainage like she was getting a sinus infection.    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 While at work she scheduled an appointment to be seen by him and was tested for flu, covid, and step and all were negative.   Pt report he listed to her lungs and told her that she was not moving air very well so he gave her an inhaler. Pt reports after 2 puffs it helped a little, but not much.   Pt reports she did 2 additional puffs for a total of 4 puffs.   Pt reports she was sent here for a blood clot evaluation.   Pt reports she was going to stay at work, but OB and her PCP really wanted her to come.

## 2021-07-07 NOTE — Discharge Instructions (Signed)

## 2021-07-29 LAB — OB RESULTS CONSOLE GBS: GBS: NEGATIVE

## 2021-08-12 ENCOUNTER — Telehealth (HOSPITAL_COMMUNITY): Payer: Self-pay | Admitting: *Deleted

## 2021-08-12 NOTE — Telephone Encounter (Signed)
Preadmission screen  

## 2021-08-14 ENCOUNTER — Other Ambulatory Visit: Payer: Self-pay | Admitting: Obstetrics and Gynecology

## 2021-08-17 ENCOUNTER — Encounter (HOSPITAL_COMMUNITY): Payer: Self-pay | Admitting: *Deleted

## 2021-08-24 ENCOUNTER — Other Ambulatory Visit: Payer: Self-pay

## 2021-08-24 ENCOUNTER — Encounter (HOSPITAL_COMMUNITY): Payer: Self-pay | Admitting: Obstetrics and Gynecology

## 2021-08-24 ENCOUNTER — Inpatient Hospital Stay (HOSPITAL_COMMUNITY)
Admission: AD | Admit: 2021-08-24 | Discharge: 2021-08-24 | Disposition: A | Discharge status: Home / Self Care | Payer: Medicaid Other | Attending: Obstetrics and Gynecology | Admitting: Obstetrics and Gynecology

## 2021-08-24 DIAGNOSIS — O368130 Decreased fetal movements, third trimester, not applicable or unspecified: Secondary | ICD-10-CM | POA: Insufficient documentation

## 2021-08-24 DIAGNOSIS — Z3689 Encounter for other specified antenatal screening: Secondary | ICD-10-CM | POA: Insufficient documentation

## 2021-08-24 DIAGNOSIS — O479 False labor, unspecified: Secondary | ICD-10-CM

## 2021-08-24 DIAGNOSIS — O471 False labor at or after 37 completed weeks of gestation: Secondary | ICD-10-CM | POA: Insufficient documentation

## 2021-08-24 DIAGNOSIS — O26893 Other specified pregnancy related conditions, third trimester: Secondary | ICD-10-CM | POA: Insufficient documentation

## 2021-08-24 DIAGNOSIS — Z0371 Encounter for suspected problem with amniotic cavity and membrane ruled out: Secondary | ICD-10-CM

## 2021-08-24 DIAGNOSIS — N898 Other specified noninflammatory disorders of vagina: Secondary | ICD-10-CM | POA: Insufficient documentation

## 2021-08-24 DIAGNOSIS — Z3A39 39 weeks gestation of pregnancy: Secondary | ICD-10-CM | POA: Diagnosis not present

## 2021-08-24 LAB — POCT FERN TEST: POCT Fern Test: NEGATIVE

## 2021-08-24 NOTE — MAU Provider Note (Signed)
?History  ?  ? ?[id number] ? ?Arrival date and time: 08/24/21 1554 ?  ? ?Chief Complaint  ?Patient presents with  ? Rupture of Membranes  ? Labor Eval  ? Decreased Fetal Movement  ? ? ? ?HPI ?Renee Jensen is a 26 y.o. at 48w5dby 6 week ultrasound who presents for contractions, vaginal discharge, & decreased fetal movement. ? Reports intermittent lower abdominal cramping all day. Also noticed some leaking at 230 pm that caused her pants to be wet. Feels like she's still leaking. No vaginal bleeding. Cervix has been 1 cm dilated for the last 2 weeks. Has noticed a decrease in fetal movement over the last few weeks. Felt baby move twice today prior to arrival. I scheduled for induction tomorrow night.  ? ?OB History   ? ? Gravida  ?3  ? Para  ?   ? Term  ?   ? Preterm  ?   ? AB  ?2  ? Living  ?   ?  ? ? SAB  ?2  ? IAB  ?   ? Ectopic  ?   ? Multiple  ?   ? Live Births  ?   ?   ?  ?  ? ? ?Past Medical History:  ?Diagnosis Date  ? ADHD   ? Adverse effect of anesthesia   ? trouble keeping asleep  ? Ankle fracture   ? left x2, right x1  ? Anxiety   ? Complication of anesthesia   ? "wakes up crying"  ? Depression   ? Headache   ? Hypothyroidism   ? Ovarian cyst   ? PONV (postoperative nausea and vomiting)   ? with some surgeries  ? Pseudotumor cerebri   ? no recent symptoms, [place]  ? Thyroid disease   ? UTI (urinary tract infection)   ? Vaginal Pap smear, abnormal   ? ok since  ? ? ?Past Surgical History:  ?Procedure Laterality Date  ? ADENOIDECTOMY  1998  ? DILATION AND CURETTAGE OF UTERUS    ? DILATION AND EVACUATION N/A 06/27/2015  ? Procedure: DILATATION AND EVACUATION;  Surgeon: MBobbye Charleston MD;  [facility];  Service: Gynecology;  Laterality: N/A;  ? DILATION AND EVACUATION N/A 10/25/2015  ? Procedure: DILATATION AND EVACUATION;  Surgeon: MBobbye Charleston MD;  [facility];  Service: Gynecology;  Laterality: N/A;  ? LAPAROSCOPIC CHOLECYSTECTOMY  04/2014  ?  TONSILLECTOMY  2018  ? TYMPANOSTOMY TUBE PLACEMENT  as child 1998  ? [facility]TRACTION  2021  ? XI ROBOTIC ASSISTED OOPHORECTOMY Left 11/14/2019  ? Procedure: XI ROBOTIC ASSISTED OOPHORECTOMY;  Surgeon: HBobbye Charleston MD;  [facility]  Service: Gynecology;  Laterality: Left;  ? XI ROBOTIC ASSISTED SALPINGECTOMY Left 11/14/2019  ? Procedure: XI ROBOTIC ASSISTED SALPINGECTOMY;  Surgeon: HBobbye Charleston MD;  [facility]  Service: Gynecology;  Laterality: Left;  ? ? ?Family History  ?Problem Relation Age of Onset  ? Kidney disease Mother   ?     tumor removed, never test for cancer, kidney removed also  ? Ulcerative colitis Mother   ? Rheum arthritis Mother   ? Fibromyalgia Mother   ? Hypothyroidism Mother   ? Cancer Father   ?     prostate  ? Diabetes Father   ? Hypertension Father   ? Heart disease Father   ? Hyperlipidemia Father   ? ? ?Allergies  ?Allergen Reactions  ?  Benzodiazepines Shortness Of Breath and Anxiety  ?  Severe anxiety, crying, shortness of breath. Pt states they have opposite effect of what they are supposed to help with  ? Diazepam Shortness Of Breath, Anxiety and Other (See Comments)  ?  Severe anxiety with all benzos ?Severe anxiety ?  ? Alprazolam Anxiety and Other (See Comments)  ?  Has opposite effect with xanax ?Hysterical crying, unable to walk and talk ? ?  ? ? ?No current facility-administered medications on file prior to encounter.  ? ?Current Outpatient Medications on File Prior to Encounter  ?Medication Sig Dispense Refill  ? famotidine (PEPCID) 20 MG tablet Take 20 mg by mouth 2 (two) times daily.    ? Prenatal Vit-Fe Fumarate-FA (PRENATAL VITAMIN PO) Take by mouth.    ? aspirin EC 81 MG tablet Take 81 mg by mouth daily. Swallow whole.    ? cyclobenzaprine (FLEXERIL) 10 MG tablet Take 1 tablet (10 mg total) by mouth 2 (two) times daily as needed for muscle spasms. 20 tablet 0  ? [DISCONTINUED] buPROPion (WELLBUTRIN XL)  300 MG 24 hr tablet Take 1 tablet (300 mg total) by mouth daily. (Patient not taking: Reported on 12/20/2019) 90 tablet 3  ? [DISCONTINUED] promethazine (PHENERGAN) 12.5 MG tablet Take 1 tablet (12.5 mg total) by mouth every 6 (six) hours as needed for nausea or vomiting. (Patient not taking: Reported on 01/06/2020) 30 tablet 0  ? [DISCONTINUED] traZODone (DESYREL) 50 MG tablet 1/2 to 1 tab po qHS PRN (Patient not taking: Reported on 01/06/2020) 30 tablet 0  ? ? ? ?ROS ?Pertinent positives and negative per HPI, all others reviewed and negative ? ?Physical Exam  ? ?BP 119/71   Pulse (!) 109   Temp 98 ?F (36.7 ?C)   Resp 18   Ht '5\' 8"'$  (1.727 m)   Wt 131.1 kg   LMP 11/19/2020   SpO2 99%   BMI 43.94 kg/m?  ? ?Patient Vitals for the past 24 hrs: ? BP Temp Pulse Resp SpO2 Height Weight  ?08/24/21 1724 119/71 -- (!) 109 -- -- -- --  ?08/24/21 1720 -- -- -- -- 99 % -- --  ?08/24/21 1715 -- -- -- -- 99 % -- --  ?08/24/21 1710 -- -- -- -- 98 % -- --  ?08/24/21 1705 -- -- -- -- 100 % -- --  ?08/24/21 1700 -- -- -- -- 99 % -- --  ?08/24/21 1655 -- -- -- -- 99 % -- --  ?08/24/21 1650 -- -- -- -- 99 % -- --  ?08/24/21 1645 -- -- -- -- 100 % -- --  ?08/24/21 1640 -- -- -- -- 100 % -- --  ?08/24/21 1625 127/78 98 ?F (36.7 ?C) (!) 105 18 -- '5\' 8"'$  (1.727 m) 131.1 kg  ? ? ?Physical Exam ?Vitals and nursing note reviewed. Exam conducted with a chaperone present.  ?Constitutional:   ?   General: She is not in acute distress. ?   Appearance: Normal appearance.  ?HENT:  ?   Head: Normocephalic and atraumatic.  ?Eyes:  ?   General: No scleral icterus. ?   Conjunctiva/sclera: Conjunctivae normal.  ?Pulmonary:  ?   Effort: Pulmonary effort is normal. No respiratory distress.  ?Genitourinary: ?   Comments: Pelvic: NEFG, no pooling. Small amount of thin white discharge. No blood.  ?Skin: ?   General: Skin is warm and dry.  ?Neurological:  ?   Mental Status: She is alert.  ?Psychiatric:     ?  Mood and Affect: Mood normal.     ?    Behavior: Behavior normal.  ?  ? ?Cervical Exam ?Dilation: 1 ?Effacement (%): 50 ?Cervical Position: Posterior ?Station: -3 ?Presentation: Vertex ?Exam by:: Jorje Guild NP ? ?FHT ?Baseline 150, moderate variability, 15x15 accels, no decels ?Toco: some UI ?Cat: 1 ? ?Labs ?Results for orders placed or performed during the hospital encounter of 08/24/21 (from the past 24 hour(s))  ?POCT fern test     Status: Normal  ? Collection Time: 08/24/21  4:49 PM  ?Result Value Ref Range  ? POCT Fern Test Negative = intact amniotic membranes   ? ? ?Imaging ?No results found. ? ?MAU Course  ?Procedures ?Lab Orders    ?     POCT fern test    ?No orders of the defined types were placed in this encounter. ? ?Imaging Orders  ?No imaging studies ordered today  ? ? ?MDM ?No regular contractions on monitor & cervix unchanged from office last week. Sterile speculum exam performed - no pooling of fluid. Fern slide collected & viewed by me - fern negative.  ? ?Fetal tracing reactive. Patient documented 9 movements in 40 minutes.  ? ?Reviewed [facility] prenatal record scanned under media. No complications with this pregnancy. GBS negative.  ? ?Dr. Harrington Challenger notified of patient's presentation & exam. Patient is stable for discharge & will keep her scheduled induction tomorrow.  ?Assessment and Plan  ? ?1. Encounter for suspected PROM, with rupture of membranes not found   ?2. False labor  ?-Reviewed labor precautions & reasons to return to MAU  ?3. Decreased fetal movements in third trimester, single or unspecified fetus  ?-Reviewed fetal kick counts ?-Reactive NST & patient reports good movement while in MAU  ?4. NST (non-stress test) reactive   ?5. [redacted] weeks gestation of pregnancy  ?-Return tomorrow evening as scheduled for induction.   ? ? ? ?Jorje Guild, NP ?08/24/21 ?5:54 PM ? ? ?

## 2021-08-24 NOTE — MAU Note (Signed)
.  Renee Jensen is a 26 y.o. at 69w5dhere in MAU reporting: pt has been having cramping/ctx off and on all day. Not real painful. Reports fetal movement has been decreased today. Around 2:30pm has some leaking that soaked her pants and still is leaking now. 1/50 on last cervical exam.  ? ?Onset of complaint: 1430 ?Pain score: 4 ?Vitals:  ? 08/24/21 1625  ?BP: 127/78  ?Pulse: (!) 105  ?Resp: 18  ?Temp: 98 ?F (36.7 ?C)  ?   ?FHT:145 ?Lab orders placed from triage:   ? ?

## 2021-08-25 ENCOUNTER — Other Ambulatory Visit: Payer: Self-pay

## 2021-08-26 ENCOUNTER — Inpatient Hospital Stay (HOSPITAL_COMMUNITY): Payer: 59 | Admitting: Anesthesiology

## 2021-08-26 ENCOUNTER — Inpatient Hospital Stay (HOSPITAL_COMMUNITY)
Admission: AD | Admit: 2021-08-26 | Discharge: 2021-08-28 | DRG: 807 | Disposition: A | Discharge status: Home / Self Care | Payer: 59 | Attending: Obstetrics and Gynecology | Admitting: Obstetrics and Gynecology

## 2021-08-26 ENCOUNTER — Inpatient Hospital Stay (HOSPITAL_COMMUNITY): Payer: 59

## 2021-08-26 ENCOUNTER — Encounter (HOSPITAL_COMMUNITY): Payer: Self-pay | Admitting: Obstetrics and Gynecology

## 2021-08-26 DIAGNOSIS — O480 Post-term pregnancy: Secondary | ICD-10-CM | POA: Diagnosis present

## 2021-08-26 DIAGNOSIS — O99284 Endocrine, nutritional and metabolic diseases complicating childbirth: Secondary | ICD-10-CM | POA: Diagnosis present

## 2021-08-26 DIAGNOSIS — E039 Hypothyroidism, unspecified: Secondary | ICD-10-CM | POA: Diagnosis present

## 2021-08-26 DIAGNOSIS — O99214 Obesity complicating childbirth: Secondary | ICD-10-CM | POA: Diagnosis present

## 2021-08-26 DIAGNOSIS — Z3A40 40 weeks gestation of pregnancy: Secondary | ICD-10-CM | POA: Diagnosis not present

## 2021-08-26 LAB — CBC
HCT: 34.6 % — ABNORMAL LOW (ref 36.0–46.0)
Hemoglobin: 11.5 g/dL — ABNORMAL LOW (ref 12.0–15.0)
MCH: 28.8 pg (ref 26.0–34.0)
MCHC: 33.2 g/dL (ref 30.0–36.0)
MCV: 86.5 fL (ref 80.0–100.0)
Platelets: 238 10*3/uL (ref 150–400)
RBC: 4 MIL/uL (ref 3.87–5.11)
RDW: 13.8 % (ref 11.5–15.5)
WBC: 12.4 10*3/uL — ABNORMAL HIGH (ref 4.0–10.5)
nRBC: 0 % (ref 0.0–0.2)

## 2021-08-26 LAB — TYPE AND SCREEN
ABO/RH(D): A POS
Antibody Screen: NEGATIVE

## 2021-08-26 LAB — RPR: RPR Ser Ql: NONREACTIVE

## 2021-08-26 MED ORDER — OXYCODONE-ACETAMINOPHEN 5-325 MG PO TABS: 2.0000 | ORAL_TABLET | ORAL | Status: DC | PRN

## 2021-08-26 MED ORDER — EPHEDRINE 5 MG/ML INJ: 10.0000 mg | INTRAVENOUS | Status: DC | PRN

## 2021-08-26 MED ORDER — FERROUS SULFATE 325 (65 FE) MG PO TABS
325.0000 mg | ORAL_TABLET | Freq: Two times a day (BID) | ORAL | Status: DC
  Administered 2021-08-27: 325 mg via ORAL
  Filled 2021-08-26: qty 1

## 2021-08-26 MED ORDER — LACTATED RINGERS IV SOLN
INTRAVENOUS | Status: DC
Start: 2021-08-26 — End: 2021-08-26

## 2021-08-26 MED ORDER — SODIUM CHLORIDE 0.9 % IV SOLN: 250.0000 mL | INTRAVENOUS | Status: DC | PRN

## 2021-08-26 MED ORDER — TERBUTALINE SULFATE 1 MG/ML IJ SOLN: 0.2500 mg | Freq: Once | INTRAMUSCULAR | Status: DC | PRN

## 2021-08-26 MED ORDER — FENTANYL-BUPIVACAINE-NACL 0.5-0.125-0.9 MG/250ML-% EP SOLN
12.0000 mL/h | EPIDURAL | Status: DC | PRN
  Administered 2021-08-26: 12 mL/h via EPIDURAL
  Filled 2021-08-26: qty 250

## 2021-08-26 MED ORDER — METHYLERGONOVINE MALEATE 0.2 MG/ML IJ SOLN: 0.2000 mg | INTRAMUSCULAR | Status: DC | PRN

## 2021-08-26 MED ORDER — IBUPROFEN 800 MG PO TABS
800.0000 mg | ORAL_TABLET | Freq: Three times a day (TID) | ORAL | Status: DC
  Administered 2021-08-26 – 2021-08-28 (×5): 800 mg via ORAL
  Filled 2021-08-26 (×5): qty 1

## 2021-08-26 MED ORDER — SIMETHICONE 80 MG PO CHEW: 80.0000 mg | CHEWABLE_TABLET | ORAL | Status: DC | PRN

## 2021-08-26 MED ORDER — LEVOTHYROXINE SODIUM 112 MCG PO TABS
112.0000 ug | ORAL_TABLET | Freq: Every day | ORAL | Status: DC
Start: 2021-08-26 — End: 2021-08-26
  Administered 2021-08-26: 112 ug via ORAL
  Filled 2021-08-26 (×2): qty 1

## 2021-08-26 MED ORDER — SODIUM CHLORIDE 0.9% FLUSH: 3.0000 mL | INTRAVENOUS | Status: DC | PRN

## 2021-08-26 MED ORDER — LIDOCAINE HCL (PF) 1 % IJ SOLN
30.0000 mL | INTRAMUSCULAR | Status: DC | PRN
Start: 2021-08-26 — End: 2021-08-26

## 2021-08-26 MED ORDER — SODIUM CHLORIDE 0.9% FLUSH: 3.0000 mL | Freq: Two times a day (BID) | INTRAVENOUS | Status: DC

## 2021-08-26 MED ORDER — ONDANSETRON HCL 4 MG/2ML IJ SOLN
4.0000 mg | Freq: Four times a day (QID) | INTRAMUSCULAR | Status: DC | PRN
Start: 2021-08-26 — End: 2021-08-26
  Administered 2021-08-26: 4 mg via INTRAVENOUS
  Filled 2021-08-26: qty 2

## 2021-08-26 MED ORDER — OXYTOCIN-SODIUM CHLORIDE 30-0.9 UT/500ML-% IV SOLN
1.0000 m[IU]/min | INTRAVENOUS | Status: DC
  Administered 2021-08-26: 2 m[IU]/min via INTRAVENOUS

## 2021-08-26 MED ORDER — METHYLERGONOVINE MALEATE 0.2 MG PO TABS: 0.2000 mg | ORAL_TABLET | ORAL | Status: DC | PRN

## 2021-08-26 MED ORDER — OXYTOCIN BOLUS FROM INFUSION
333.0000 mL | Freq: Once | INTRAVENOUS | Status: AC
  Administered 2021-08-26: 333 mL via INTRAVENOUS

## 2021-08-26 MED ORDER — SOD CITRATE-CITRIC ACID 500-334 MG/5ML PO SOLN
30.0000 mL | ORAL | Status: DC | PRN
  Administered 2021-08-26: 30 mL via ORAL
  Filled 2021-08-26: qty 30

## 2021-08-26 MED ORDER — MISOPROSTOL 25 MCG QUARTER TABLET
25.0000 ug | ORAL_TABLET | ORAL | Status: DC | PRN
Start: 2021-08-26 — End: 2021-08-26
  Administered 2021-08-26 (×2): 25 ug via VAGINAL
  Filled 2021-08-26 (×2): qty 1

## 2021-08-26 MED ORDER — ZOLPIDEM TARTRATE 5 MG PO TABS: 5.0000 mg | ORAL_TABLET | Freq: Every evening | ORAL | Status: DC | PRN

## 2021-08-26 MED ORDER — MAGNESIUM HYDROXIDE 400 MG/5ML PO SUSP: 30.0000 mL | ORAL | Status: DC | PRN

## 2021-08-26 MED ORDER — LIDOCAINE HCL (PF) 1 % IJ SOLN
INTRAMUSCULAR | Status: DC | PRN
  Administered 2021-08-26 (×2): 4 mL via EPIDURAL

## 2021-08-26 MED ORDER — BENZOCAINE-MENTHOL 20-0.5 % EX AERO: 1.0000 "application " | INHALATION_SPRAY | CUTANEOUS | Status: DC | PRN

## 2021-08-26 MED ORDER — ACETAMINOPHEN 325 MG PO TABS
650.0000 mg | ORAL_TABLET | ORAL | Status: DC | PRN
  Administered 2021-08-27 – 2021-08-28 (×4): 650 mg via ORAL
  Filled 2021-08-26 (×4): qty 2

## 2021-08-26 MED ORDER — PRENATAL MULTIVITAMIN CH
1.0000 | ORAL_TABLET | Freq: Every day | ORAL | Status: DC
  Administered 2021-08-27 – 2021-08-28 (×2): 1 via ORAL
  Filled 2021-08-26 (×2): qty 1

## 2021-08-26 MED ORDER — OXYTOCIN-SODIUM CHLORIDE 30-0.9 UT/500ML-% IV SOLN
2.5000 [IU]/h | INTRAVENOUS | Status: DC
  Administered 2021-08-26: 2.5 [IU]/h via INTRAVENOUS
  Filled 2021-08-26: qty 500

## 2021-08-26 MED ORDER — CYCLOBENZAPRINE HCL 10 MG PO TABS: 10.0000 mg | ORAL_TABLET | Freq: Two times a day (BID) | ORAL | Status: DC | PRN

## 2021-08-26 MED ORDER — MEASLES, MUMPS & RUBELLA VAC IJ SOLR: 0.5000 mL | Freq: Once | INTRAMUSCULAR | Status: DC

## 2021-08-26 MED ORDER — FAMOTIDINE 20 MG PO TABS
20.0000 mg | ORAL_TABLET | Freq: Two times a day (BID) | ORAL | Status: DC
  Administered 2021-08-27 – 2021-08-28 (×3): 20 mg via ORAL
  Filled 2021-08-26 (×3): qty 1

## 2021-08-26 MED ORDER — LACTATED RINGERS IV SOLN: 500.0000 mL | INTRAVENOUS | Status: DC | PRN

## 2021-08-26 MED ORDER — ACETAMINOPHEN 325 MG PO TABS: 650.0000 mg | ORAL_TABLET | ORAL | Status: DC | PRN

## 2021-08-26 MED ORDER — SENNOSIDES-DOCUSATE SODIUM 8.6-50 MG PO TABS
2.0000 | ORAL_TABLET | Freq: Every day | ORAL | Status: DC
  Administered 2021-08-27 – 2021-08-28 (×2): 2 via ORAL
  Filled 2021-08-26 (×2): qty 2

## 2021-08-26 MED ORDER — LACTATED RINGERS IV SOLN
500.0000 mL | Freq: Once | INTRAVENOUS | Status: AC
  Administered 2021-08-26: 500 mL via INTRAVENOUS

## 2021-08-26 MED ORDER — TETANUS-DIPHTH-ACELL PERTUSSIS 5-2.5-18.5 LF-MCG/0.5 IM SUSY: 0.5000 mL | PREFILLED_SYRINGE | Freq: Once | INTRAMUSCULAR | Status: DC

## 2021-08-26 MED ORDER — DIPHENHYDRAMINE HCL 50 MG/ML IJ SOLN: 12.5000 mg | INTRAMUSCULAR | Status: DC | PRN

## 2021-08-26 MED ORDER — FLEET ENEMA 7-19 GM/118ML RE ENEM: 1.0000 | ENEMA | RECTAL | Status: DC | PRN

## 2021-08-26 MED ORDER — DIPHENHYDRAMINE HCL 25 MG PO CAPS: 25.0000 mg | ORAL_CAPSULE | Freq: Four times a day (QID) | ORAL | Status: DC | PRN

## 2021-08-26 MED ORDER — COCONUT OIL OIL: 1.0000 "application " | TOPICAL_OIL | Status: DC | PRN

## 2021-08-26 MED ORDER — WITCH HAZEL-GLYCERIN EX PADS: 1.0000 "application " | MEDICATED_PAD | CUTANEOUS | Status: DC | PRN

## 2021-08-26 MED ORDER — ONDANSETRON HCL 4 MG/2ML IJ SOLN: 4.0000 mg | INTRAMUSCULAR | Status: DC | PRN

## 2021-08-26 MED ORDER — LEVOTHYROXINE SODIUM 112 MCG PO TABS
112.0000 ug | ORAL_TABLET | Freq: Every day | ORAL | Status: DC
  Filled 2021-08-26: qty 1

## 2021-08-26 MED ORDER — DIBUCAINE (PERIANAL) 1 % EX OINT: 1.0000 "application " | TOPICAL_OINTMENT | CUTANEOUS | Status: DC | PRN

## 2021-08-26 MED ORDER — PHENYLEPHRINE 40 MCG/ML (10ML) SYRINGE FOR IV PUSH (FOR BLOOD PRESSURE SUPPORT)
80.0000 ug | PREFILLED_SYRINGE | INTRAVENOUS | Status: DC | PRN
  Filled 2021-08-26: qty 10

## 2021-08-26 MED ORDER — ONDANSETRON HCL 4 MG PO TABS: 4.0000 mg | ORAL_TABLET | ORAL | Status: DC | PRN

## 2021-08-26 MED ORDER — PHENYLEPHRINE 40 MCG/ML (10ML) SYRINGE FOR IV PUSH (FOR BLOOD PRESSURE SUPPORT): 80.0000 ug | PREFILLED_SYRINGE | INTRAVENOUS | Status: DC | PRN

## 2021-08-26 MED ORDER — OXYCODONE-ACETAMINOPHEN 5-325 MG PO TABS: 1.0000 | ORAL_TABLET | ORAL | Status: DC | PRN

## 2021-08-26 NOTE — Lactation Note (Signed)
This note was copied from a baby's chart. ?Lactation Consultation Note ?Mom stated baby has been cueing to BF but wasn't that interested in BF once got baby latched. ?Baby did finally start suckling. ?Baby is alert. ?Will f/u on MBU. ? ?Patient Name: Renee Jensen ?Today's Date: 08/26/2021 ?Reason for consult: L&D Initial assessment;Primapara;Term;Maternal endocrine disorder ?Age:27 hours ? ?Maternal Data ?  ? ?Feeding ?  ? ?LATCH Score ?Latch: Repeated attempts needed to sustain latch, nipple held in mouth throughout feeding, stimulation needed to elicit sucking reflex. ? ?Audible Swallowing: None ? ?Type of Nipple: Everted at rest and after stimulation (short shaft) ? ?Comfort (Breast/Nipple): Soft / non-tender ? ?Hold (Positioning): Full assist, staff holds infant at breast ? ?LATCH Score: 5 ? ? ?Lactation Tools Discussed/Used ?  ? ?Interventions ?  ? ?Discharge ?  ? ?Consult Status ?Consult Status: Follow-up from L&D ?Date: 08/27/21 ?Follow-up type: In-patient ? ? ? ?Theodoro Kalata ?08/26/2021, 10:33 PM ? ? ? ?

## 2021-08-26 NOTE — Anesthesia Procedure Notes (Signed)
Epidural ?Patient location during procedure: OB ?Start time: 08/26/2021 8:58 AM ?End time: 08/26/2021 9:02 AM ? ?Staffing ?Anesthesiologist: Brennan Bailey, MD ?Performed: anesthesiologist  ? ?Preanesthetic Checklist ?Completed: patient identified, IV checked, risks and benefits discussed, monitors and equipment checked, pre-op evaluation and timeout performed ? ?Epidural ?Patient position: sitting ?Prep: DuraPrep and site prepped and draped ?Patient monitoring: continuous pulse ox, blood pressure and heart rate ?Approach: midline ?Location: L3-L4 ?Injection technique: LOR air ? ?Needle:  ?Needle type: Tuohy  ?Needle gauge: 17 G ?Needle length: 9 cm ?Needle insertion depth: 7 cm ?Catheter type: closed end flexible ?Catheter size: 19 Gauge ?Catheter at skin depth: 12 cm ?Test dose: negative and Other (1% lidocaine) ? ?Assessment ?Events: blood not aspirated, injection not painful, no injection resistance, no paresthesia and negative IV test ? ?Additional Notes ?Patient identified. Risks, benefits, and alternatives discussed with patient including but not limited to bleeding, infection, nerve damage, paralysis, failed block, incomplete pain control, headache, blood pressure changes, nausea, vomiting, reactions to medication, itching, and postpartum back pain. Confirmed with bedside nurse the patient's most recent platelet count. Confirmed with patient that they are not currently taking any anticoagulation, have any bleeding history, or any family history of bleeding disorders. Patient expressed understanding and wished to proceed. All questions were answered. Sterile technique was used throughout the entire procedure. Please see nursing notes for vital signs.  ? ?Crisp LOR after one needle redirection. Test dose was given through epidural catheter and negative prior to continuing to dose epidural or start infusion. Warning signs of high block given to the patient including shortness of breath, tingling/numbness in  hands, complete motor block, or any concerning symptoms with instructions to call for help. Patient was given instructions on fall risk and not to get out of bed. All questions and concerns addressed with instructions to call with any issues or inadequate analgesia.  Reason for block:procedure for pain ? ? ? ?

## 2021-08-26 NOTE — H&P (Signed)
26 y.o. [redacted]w[redacted]d G3P0020 comes in for induction at term (increased BMI).  Otherwise has good fetal movement and no bleeding. ? ?Past Medical History:  ?Diagnosis Date  ? ADHD   ? Adverse effect of anesthesia   ? trouble keeping asleep  ? Ankle fracture   ? left x2, right x1  ? Anxiety   ? Complication of anesthesia   ? "wakes up crying"  ? Depression   ? Headache   ? Hypothyroidism   ? Ovarian cyst   ? PONV (postoperative nausea and vomiting)   ? with some surgeries  ? Pseudotumor cerebri   ? no recent symptoms, [place]  ? Thyroid disease   ? UTI (urinary tract infection)   ? Vaginal Pap smear, abnormal   ? ok since  ?  ?Past Surgical History:  ?Procedure Laterality Date  ? ADENOIDECTOMY  1998  ? DILATION AND CURETTAGE OF UTERUS    ? DILATION AND EVACUATION N/A 06/27/2015  ? Procedure: DILATATION AND EVACUATION;  Surgeon: MBobbye Charleston MD;  [facility];  Service: Gynecology;  Laterality: N/A;  ? DILATION AND EVACUATION N/A 10/25/2015  ? Procedure: DILATATION AND EVACUATION;  Surgeon: MBobbye Charleston MD;  [facility];  Service: Gynecology;  Laterality: N/A;  ? LAPAROSCOPIC CHOLECYSTECTOMY  04/2014  ? TONSILLECTOMY  2018  ? TYMPANOSTOMY TUBE PLACEMENT  as child 1998  ? [place] [facility]  ? XI ROBOTIC ASSISTED OOPHORECTOMY Left 11/14/2019  ? Procedure: XI ROBOTIC ASSISTED OOPHORECTOMY;  Surgeon: HBobbye Charleston MD;  [facility]  Service: Gynecology;  Laterality: Left;  ? XI ROBOTIC ASSISTED SALPINGECTOMY Left 11/14/2019  ? Procedure: XI ROBOTIC ASSISTED SALPINGECTOMY;  Surgeon: HBobbye Charleston MD;  [facility]  Service: [facility];  Laterality: Left;  ?  ?OB History  ?Gravida Para Term Preterm AB Living  ?3       2    ?SAB IAB Ectopic Multiple Live Births  ?2          ?  ?# Outcome Date GA Lbr Len/2nd Weight Sex Delivery Anes PTL Lv  ?3 Current           ?2 SAB 10/2015          ?1 SAB 06/27/15           ?  ?Social History  ? ?Socioeconomic History  ? Marital status: Single  ?  Spouse name: Not on file  ? Number of children: Not on file  ? Years of education: Not on file  ? Highest education level: Not on file  ?Occupational History  ? Not on file  ?Tobacco Use  ? Smoking status: Never  ? Smokeless tobacco: Never  ?Vaping Use  ? Vaping Use: Never used  ?Substance and Sexual Activity  ? Alcohol use: Not Currently  ?  Comment: socially  ? Drug use: No  ? Sexual activity: Yes  ?Other Topics Concern  ? Not on file  ?Social History Narrative  ? Not on file  ? ?Social Determinants of Health  ? ?Financial Resource Strain: Not on file  ?Food Insecurity: Not on file  ?Transportation Needs: Not on file  ?Physical Activity: Not on file  ?Stress: Not on file  ?Social Connections: Not on file  ?Intimate Partner Violence: Not on file  ? Benzodiazepines, Diazepam, and Alprazolam  ? ? ?Prenatal Transfer Tool  ?Maternal Diabetes: No ?Genetic Screening: Declined ?Maternal Ultrasounds/Referrals: Normal ?Fetal Ultrasounds  or other Referrals:  None ?Maternal Substance Abuse:  No ?Significant Maternal Medications:  Meds include: Syntroid ?Significant Maternal Lab Results: Group B Strep negative ? ?Other PNC: uncomplicated. ? ? ? ?Vitals:  ? 08/26/21 0254 08/26/21 0347 08/26/21 0453 08/26/21 0553  ?BP: 122/63 122/77 124/72 136/73  ?Pulse: 93 91 92 99  ?Resp: '16  14 14  '$ ?Temp:    98.1 ?F (36.7 ?C)  ?TempSrc:    Oral  ?Weight:      ?Height:      ? ? ?Lungs/Cor:  NAD ?Abdomen:  soft, gravid ?Ex:  no cords, erythema ?SVE:  1.5/70/-1 ?FHTs:  120, good STV, NST R; Cat 1 tracing. ?Toco:  q irreg ? ? ?A/P   Term induction BMI. ? GBS neg.  RI. ? ?[place]  ?

## 2021-08-26 NOTE — Anesthesia Preprocedure Evaluation (Signed)
Anesthesia Evaluation  ?Patient identified by MRN, date of birth, ID band ?Patient awake ? ? ? ?Reviewed: ?Allergy & Precautions, Patient's Chart, lab work & pertinent test results ? ?History of Anesthesia Complications ?(+) PONV and history of anesthetic complications ? ?Airway ?Mallampati: II ? ?TM Distance: >3 FB ?Neck ROM: Full ? ? ? Dental ?no notable dental hx. ? ?  ?Pulmonary ?neg pulmonary ROS,  ?  ?Pulmonary exam normal ? ? ? ? ? ? ? Cardiovascular ?negative cardio ROS ?Normal cardiovascular exam ? ? ?  ?Neuro/Psych ?Anxiety Depression Pseudotumor cerebri ?  ? GI/Hepatic ?negative GI ROS, Neg liver ROS,   ?Endo/Other  ?Hypothyroidism Morbid obesity ? Renal/GU ?negative Renal ROS  ?negative genitourinary ?  ?Musculoskeletal ?negative musculoskeletal ROS ?(+)  ? Abdominal ?  ?Peds ? Hematology ?negative hematology ROS ?(+)   ?Anesthesia Other Findings ?Day of surgery medications reviewed with patient. ? Reproductive/Obstetrics ?(+) Pregnancy ? ?  ? ? ? ? ? ? ? ? ? ? ? ? ? ?  ?  ? ? ? ? ? ? ? ? ?Anesthesia Physical ?Anesthesia Plan ? ?ASA: 3 ? ?Anesthesia Plan: Epidural  ? ?Post-op Pain Management:   ? ?Induction:  ? ?PONV Risk Score and Plan: Treatment may vary due to age or medical condition ? ?Airway Management Planned: Natural Airway ? ?Additional Equipment: Fetal Monitoring ? ?Intra-op Plan:  ? ?Post-operative Plan:  ? ?Informed Consent: I have reviewed the patients History and Physical, chart, labs and discussed the procedure including the risks, benefits and alternatives for the proposed anesthesia with the patient or authorized representative who has indicated his/her understanding and acceptance.  ? ? ? ? ? ?Plan Discussed with:  ? ?Anesthesia Plan Comments:   ? ? ? ? ? ? ?Anesthesia Quick Evaluation ? ?

## 2021-08-27 LAB — CBC
HCT: 32.3 % — ABNORMAL LOW (ref 36.0–46.0)
Hemoglobin: 10.7 g/dL — ABNORMAL LOW (ref 12.0–15.0)
MCH: 28.8 pg (ref 26.0–34.0)
MCHC: 33.1 g/dL (ref 30.0–36.0)
MCV: 87.1 fL (ref 80.0–100.0)
Platelets: 212 10*3/uL (ref 150–400)
RBC: 3.71 MIL/uL — ABNORMAL LOW (ref 3.87–5.11)
RDW: 13.9 % (ref 11.5–15.5)
WBC: 15.1 10*3/uL — ABNORMAL HIGH (ref 4.0–10.5)
nRBC: 0 % (ref 0.0–0.2)

## 2021-08-27 MED ORDER — THYROID 60 MG PO TABS
60.0000 mg | ORAL_TABLET | Freq: Every day | ORAL | Status: DC
Start: 2021-08-28 — End: 2021-08-28
  Filled 2021-08-27: qty 1

## 2021-08-27 MED ORDER — POLYETHYLENE GLYCOL 3350 17 G PO PACK: 17.0000 g | PACK | Freq: Every day | ORAL | Status: DC | PRN

## 2021-08-27 NOTE — Progress Notes (Signed)
Patient is doing well.  She is ambulating, voiding, tolerating PO.  Pain control is good.  Lochia is appropriate ? ?Was on armour thryroid prior to pregnancy.  Switched to levothyroxine w pregnancy--requests to switch back to Armour at this time.  Also worries about constipation--was a struggle during pregnancy.  ?Vitals:  ? 08/26/21 2300 08/27/21 0000 08/27/21 0445 08/27/21 0842  ?BP: 116/67 121/73 (!) 120/59 119/71  ?Pulse: (!) 109 (!) 104 97 95  ?Resp: '18 18 18 16  '$ ?Temp: 98.2 ?F (36.8 ?C) 98 ?F (36.7 ?C) 98.2 ?F (36.8 ?C) 98.4 ?F (36.9 ?C)  ?TempSrc: Oral Oral Oral Oral  ?SpO2: 100% 100% 100% 99%  ?Weight:      ?Height:      ? ? ?NAD ?Fundus firm ?Ext: 1+ edema bilaterally ? ?Lab Results  ?Component Value Date  ? WBC 15.1 (H) 08/27/2021  ? HGB 10.7 (L) 08/27/2021  ? HCT 32.3 (L) 08/27/2021  ? MCV 87.1 08/27/2021  ? PLT 212 08/27/2021  ? ? ?--/--/A POS (03/15 0049) ? ?A/P 26 y.o. [id number] PPD#1. ?Routine care.   ?Hypothyroidism--switched to armour per patient request ?Constipation--on senna-docusate.  Will add miralax prn.  Will hold additional iron for now (hemoglobin only mildly low at 10.7) ?Expect d/c tomorrow.   ? ?[place] ? ?

## 2021-08-27 NOTE — Social Work (Signed)
Renee Jensen received consult for hx of Anxiety and Depression.  Renee Jensen met with Renee Jensen to offer support and complete assessment.   ? ?Renee Jensen met with Renee Jensen at bedside and introduced Renee Jensen role. Renee Jensen observed Renee Jensen in bed holding the infant and FOB was present at bedside. Renee Jensen presented pleasant and engaged with Renee Jensen. Renee Jensen welcomed Renee Jensen to complete the assessment with FOB present. Renee Jensen inquired how Renee Jensen has felt since giving birth. Renee Jensen reported feeling fine. Renee Jensen shared the delivery went much better than expected. Renee Jensen inquired how Renee Jensen felt emotionally during the pregnancy. Renee Jensen reported feeling regular hormonal emotions. Renee Jensen inquired about Renee Jensen history of anxiety and depression. Renee Jensen reported that she has experienced anxiety and depression since high school however she was officially diagnosed in 2019. Renee Jensen reported at the time she was going through a divorce. Renee Jensen reported she was prescribed Lexapro, Wellbutrin and Buspar for her symptoms. Renee Jensen reported she weaned herself off the medications and has felt "100 percent better." Renee Jensen reported also saw a therapist which was helpful at the time. Renee Jensen inquired about Renee Jensen coping strategies. Renee Jensen gave example of being with her dog makes her happy. Renee Jensen discussed additional coping skills and encouraged Renee Jensen to implement them. Renee Jensen acknowledged her significant other and parents as supports. Renee Jensen discussed PPD. Renee Jensen provided education regarding the baby blues period vs. perinatal mood disorders, discussed treatment and gave resources for mental health follow up if concerns arise. Renee Jensen recommended Renee Jensen complete a self-evaluation during the postpartum time period using the New Mom Checklist from Postpartum Progress and encouraged Renee Jensen to contact a medical professional if symptoms are noted at any time. Renee Jensen reported she feels very comfortable reaching out to her PCP as she works in his [place] office, and he is very knowledgeable of her mental health history. Renee Jensen assessed Renee Jensen for safety. Renee Jensen denied thoughts of harm to self and  others.  ? ?Renee Jensen reported she has essential items for the infant including a bassinet where the infant will sleep. Renee Jensen provided review of Sudden Infant Death Syndrome (SIDS) precautions. Renee Jensen reported understanding. Renee Jensen has chosen [facility] for the infant's follow up care. Renee Jensen assessed Renee Jensen for additional need. Renee Jensen reported no further need.  ? ?Renee Jensen identifies no further need for intervention and no barriers to discharge at this time. ? ?Kathrin Greathouse, MSW, LCSW ?[facility]  ?Clinical Social Worker  ?[phone] ?08/27/2021  12:18 PM  ?

## 2021-08-27 NOTE — Lactation Note (Signed)
This note was copied from a baby's chart. ?Lactation Consultation Note ? ?Patient Name: Renee Jensen ?Today's Date: 08/27/2021 ?Reason for consult: Follow-up assessment;1st time breastfeeding;Primapara;Maternal endocrine disorder;Mother's request;Infant weight loss ?Age:26 years ? ?Visited with mom of 26 years old FT female, she's a P1 and called for assistance because baby has trouble latching on to the left breast. Her RN has already assisted with the hand pump for nipple eversion; mom voiced that she's been leaking colostrum for a month already. ? ?Baby not ready to latch at this time, mom voiced she just tried and baby fell asleep after a few sucks. Reviewed normal newborn behavior, expectations, feeding cues, cluster feeding and size of baby's stomach. Mom willing to work on hand expression and asked LC to show how to spoon feed baby. Colostrum easily obtained from right breast, baby took 3 ml of EBM, praised mom for her efforts. ? ?Feeding ?Mother's Current Feeding Choice: Breast Milk ? ?Lactation Tools Discussed/Used ?Tools: Pump ?Breast pump type: Manual ?Pump Education: Setup, frequency, and cleaning ?Reason for Pumping: nipple eversion (right side) ?Pumping frequency: prior latching ? ?Interventions ?Interventions: Breast feeding basics reviewed;Breast massage;Hand express;Expressed milk;Hand pump;Education ? ?Plan of care ?Encouraged mom to keep taking baby to breast on feeding cues 8-12 times/24 hours or sooner if feeding cues are present ?She'll pre-pump right breast prior latching, no need to pre-pump left breast since nipple is more everted on that side ?If baby is still not latching, parents will spoon feed  ? ?FOB present and supportive. All questions and concerns answered, parents to call [facility] services PRN. ? ?Discharge ?Pump: Manual;Personal (Spectra) ? ?Consult Status ?Consult Status: Follow-up ?Date: 08/28/21 ?Follow-up type: In-patient ? ? ?Matthews Franks S Kelsay Haggard ?08/27/2021, 6:03 PM ? ? ? ?

## 2021-08-27 NOTE — Lactation Note (Signed)
This note was copied from a baby's chart. ?Lactation Consultation Note ? ?Patient Name: Renee Jensen ?Today's Date: 08/27/2021 ?Reason for consult: Initial assessment;Primapara;1st time breastfeeding;Term;Maternal endocrine disorder hypothyroidism ?Age:27 hours ? ?LC in to visit with P2 Mom of term baby.  Baby being assessed by RN.  Offered to assist with postioning at the breast.  ? ?Baby quiet alert.  Assisted with football hold on left breast.  Mom taught to support baby's head from ear to ear and support her breast and after hand expressing colostrum, wait for a wide mouth and bring to breast quickly.  Baby did make smacking noises.  Took baby off and relatched baby with breast compression to facilitate a deeper latch.  Taught FOB how to assess a deep latch and pull on chin to flange lower lip.  Mom knows to watch for a deep jaw extensions and swallows. ? ?Encouraged STS and offering the breast with cues.   ?Mom to call for assistance prn. ? ? ? ?Maternal Data ?Has patient been taught Hand Expression?: Yes ?Does the patient have breastfeeding experience prior to this delivery?: No ? ?Feeding ?Mother's Current Feeding Choice: Breast Milk ? ?LATCH Score ?Latch: Grasps breast easily, tongue down, lips flanged, rhythmical sucking. (after a couple attempts) ? ?Audible Swallowing: Spontaneous and intermittent ? ?Type of Nipple: Everted at rest and after stimulation ? ?Comfort (Breast/Nipple): Soft / non-tender ? ?Hold (Positioning): Assistance needed to correctly position infant at breast and maintain latch. ? ?LATCH Score: 9 ? ? ?Interventions ?Interventions: Breast feeding basics reviewed;Assisted with latch;Skin to skin;Breast massage;Breast compression;Adjust position;Support pillows;Position options;Expressed milk;LC Services brochure ? ?Discharge ?Pump: Personal (Spectra) ? ?Consult Status ?Consult Status: Follow-up ?Date: 08/28/21 ?Follow-up type: In-patient ? ? ? ?[name] ?08/27/2021, 9:22  AM ? ? ? ?

## 2021-08-27 NOTE — Lactation Note (Signed)
This note was copied from a baby's chart. ?Lactation Consultation Note ?Have been waiting on mom to call out to see mom. Inquired again about seeing mom. RN stated mom is sleeping. ? ?Patient Name: Renee Jensen ?Today's Date: 08/27/2021 ?  ?Age:27 hours ? ?Maternal Data ?  ? ?Feeding ?  ? ?LATCH Score ?  ? ?  ? ?  ? ?  ? ?  ? ?  ? ? ?Lactation Tools Discussed/Used ?  ? ?Interventions ?  ? ?Discharge ?  ? ?Consult Status ?  ? ? ? ?Theodoro Kalata ?08/27/2021, 6:19 AM ? ? ? ?

## 2021-08-27 NOTE — Anesthesia Postprocedure Evaluation (Signed)
Anesthesia Post Note ? ?Patient: [name] ? ?Procedure(s) Performed: AN AD HOC LABOR EPIDURAL ? ?  ? ?Patient location during evaluation: Mother Baby ?Anesthesia Type: Epidural ?Level of consciousness: awake ?Pain management: satisfactory to patient ?Vital Signs Assessment: post-procedure vital signs reviewed and stable ?Respiratory status: spontaneous breathing ?Cardiovascular status: stable ?Anesthetic complications: no ? ? ?No notable events documented. ? ?Last Vitals:  ?Vitals:  ? 08/27/21 0842 08/27/21 1305  ?BP: 119/71 114/60  ?Pulse: 95 91  ?Resp: 16 18  ?Temp: 36.9 ?C 36.7 ?C  ?SpO2: 99% 99%  ?  ?Last Pain:  ?Vitals:  ? 08/27/21 1305  ?TempSrc: Oral  ?PainSc: 2   ? ?Pain Goal: Patients Stated Pain Goal: 2 (08/27/21 1305) ? ?  ?  ?  ?  ?  ?  ?  ? ?[name] ? ? ? ? ?

## 2021-08-28 LAB — COMPREHENSIVE METABOLIC PANEL
ALT: 14 U/L (ref 0–44)
AST: 18 U/L (ref 15–41)
Albumin: 2.5 g/dL — ABNORMAL LOW (ref 3.5–5.0)
Alkaline Phosphatase: 74 U/L (ref 38–126)
Anion gap: 8 (ref 5–15)
BUN: 8 mg/dL (ref 6–20)
CO2: 23 mmol/L (ref 22–32)
Calcium: 9 mg/dL (ref 8.9–10.3)
Chloride: 106 mmol/L (ref 98–111)
Creatinine, Ser: 0.8 mg/dL (ref 0.44–1.00)
GFR, Estimated: >60 mL/min (ref >60)
Glucose, Bld: 96 mg/dL (ref 70–99)
Potassium: 4.1 mmol/L (ref 3.5–5.1)
Sodium: 137 mmol/L (ref 135–145)
Total Bilirubin: 0.3 mg/dL (ref 0.3–1.2)
Total Protein: 5.6 g/dL — ABNORMAL LOW (ref 6.5–8.1)

## 2021-08-28 LAB — LIPASE, BLOOD: Lipase: 30 U/L (ref 11–51)

## 2021-08-28 MED ORDER — IBUPROFEN 200 MG PO TABS: 600.0000 mg | ORAL_TABLET | Freq: Three times a day (TID) | ORAL | Status: AC | PRN

## 2021-08-28 NOTE — Lactation Note (Signed)
This note was copied from a baby's chart. ?Lactation Consultation Note ? ?Patient Name: Renee Jensen ?Today's Date: 08/28/2021 ?Reason for consult: Follow-up assessment;Primapara;1st time breastfeeding;Term ?Age:27 hours ? ? ?P1 mother whose infant is now 59 hours old.  This is a term baby at 40+0 weeks.  Mother's current feeding preference is to pump and bottle feed.  She is currently formula feeding. ? ?Reviewed mother's goals for after discharge.  Discussed current feeding plan and plan for after discharge.  Mother is no longer interested in latching at all.  She has started pumping as of 0230 this morning and most recently obtained 3 mls of EBM.  Encouraged breast massage and hand expression before/after pumping to help establish a good milk supply.  Offered to observe flange size and review pump prior to discharge; mother receptive. ? ?Observed her pumping using the #27 flange size which is appropriate at this time.  Taught mother how to correctly assess flange size as pumping continues.  She has a Spectra DEBP for home use.  Provided coconut oil for nipple comfort since mother's nipples are tender; suggested using EBM prior to coconut oil.  No nipple trauma other than redness noted.   ? ?Family has our OP phone number for questions after discharge; awaiting the pediatrician's visit.  Father present.  RN updated. ? ? ?Maternal Data ?Has patient been taught Hand Expression?: Yes ?Does the patient have breastfeeding experience prior to this delivery?: No ? ?Feeding ?Mother's Current Feeding Choice: Breast Milk and Formula ? ?LATCH Score ?  ? ?  ? ?  ? ?  ? ?  ? ?  ? ? ?Lactation Tools Discussed/Used ?Tools: Pump;Flanges ?Flange Size: 24 ?Breast pump type: Double-Electric Breast Pump;Manual ?Pump Education: Setup, frequency, and cleaning;Milk Storage (Reviewed) ?Reason for Pumping: Mother is pumping and bottle feeding only ?Pumping frequency: Every three hours ?Pumped volume: 3  mL ? ?Interventions ?Interventions: Education ? ?Discharge ?Discharge Education: Engorgement and breast care ?Pump: Personal;Manual;DEBP (Spectra) ? ?Consult Status ?Consult Status: Complete ?Date: 08/28/21 ?Follow-up type: Call as needed ? ? ? ?Zaelynn Fuchs R Ashleah Valtierra ?08/28/2021, 11:32 AM ? ? ? ?

## 2021-08-28 NOTE — Discharge Summary (Signed)
? ?  Postpartum Discharge Summary ? ?Date of Service updated 08/28/21  ? ?   ?Patient Name: Renee Jensen ?DOB: [birth year] ?MRN: [id number] ? ?Date of admission: 08/26/2021 ?Delivery date:08/26/2021  ?Delivering provider: Bobbye Charleston  ?Date of discharge: 08/28/2021 ? ?Admitting diagnosis: Post term pregnancy [O48.0] ?Intrauterine pregnancy: [redacted]w[redacted]d    ?Secondary diagnosis:  Principal Problem: ?  Post term pregnancy ? ?Additional problems: A2GDM, hypothyroid    ?Discharge diagnosis: Term Pregnancy Delivered and GDM A2                                              ?Post partum procedures: none ?Augmentation: AROM, Pitocin, and Cytotec ?Complications: None ? ?Hospital course: Induction of Labor With Vaginal Delivery   ?26y.o. yo G3P1021 at 428w0das admitted to the hospital 08/26/2021 for induction of labor.  Indication for induction: A2 DM.  Patient had an uncomplicated labor course as follows: ?Membrane Rupture Time/Date: 8:22 AM ,08/26/2021   ?Delivery Method:Vaginal, Spontaneous  ?Episiotomy: None  ?Lacerations:  1st degree  ?Details of delivery can be found in separate delivery note.  Patient had a routine postpartum course. Patient is discharged home 08/28/21. ? ?Newborn Data: ?Birth date:08/26/2021  ?Birth time:9:36 PM  ?Gender:Female  ?Living status:Living  ?Apgars:5 ,8  ?Weight:3520 g  ? ?Magnesium Sulfate received: No ?BMZ received: No ?Rhophylac:N/A ?MMR:N/A ?T-DaP:Given prenatally ?Flu: No ?Transfusion:No ? ?Physical exam  ?Vitals:  ? 08/27/21 0842 08/27/21 1305 08/27/21 1954 08/28/21 0535  ?BP: 119/71 114/60 116/75 (!) 102/48  ?Pulse: 95 91 95 69  ?Resp: 16 18 17 16   ?Temp: 98.4 ?F (36.9 ?C) 98.1 ?F (36.7 ?C) 98.5 ?F (36.9 ?C) 98.5 ?F (36.9 ?C)  ?TempSrc: Oral Oral Oral Oral  ?SpO2: 99% 99% 100%   ?Weight:      ?Height:      ? ?General: alert, cooperative, and no distress ?Lochia: appropriate ?Uterine Fundus: firm ?Incision: N/A ?DVT Evaluation: No evidence of DVT seen on physical exam. ?Labs: ?Lab Results   ?Component Value Date  ? WBC 15.1 (H) 08/27/2021  ? HGB 10.7 (L) 08/27/2021  ? HCT 32.3 (L) 08/27/2021  ? MCV 87.1 08/27/2021  ? PLT 212 08/27/2021  ? ?CMP Latest Ref Rng & Units 08/28/2021  ?Glucose 70 - 99 mg/dL 96  ?BUN 6 - 20 mg/dL 8  ?Creatinine 0.44 - 1.00 mg/dL 0.80  ?Sodium 135 - 145 mmol/L 137  ?Potassium 3.5 - 5.1 mmol/L 4.1  ?Chloride 98 - 111 mmol/L 106  ?CO2 22 - 32 mmol/L 23  ?Calcium 8.9 - 10.3 mg/dL 9.0  ?Total Protein 6.5 - 8.1 g/dL 5.6(L)  ?Total Bilirubin 0.3 - 1.2 mg/dL 0.3  ?Alkaline Phos 38 - 126 U/L 74  ?AST 15 - 41 U/L 18  ?ALT 0 - 44 U/L 14  ? ?Edinburgh Score: ?Edinburgh Postnatal Depression Scale Screening Tool 08/27/2021  ?I have been able to laugh and see the funny side of things. 0  ?I have looked forward with enjoyment to things. 0  ?I have blamed myself unnecessarily when things went wrong. 1  ?I have been anxious or worried for no good reason. 2  ?I have felt scared or panicky for no good reason. 1  ?Things have been getting on top of me. 1  ?I have been so unhappy that I have had difficulty sleeping. 0  ?I have felt sad or miserable. 0  ?  I have been so unhappy that I have been crying. 0  ?The thought of harming myself has occurred to me. 0  ?Edinburgh Postnatal Depression Scale Total 5  ? ? ? ? ?After visit meds:  ?Allergies as of 08/28/2021   ? ?   Reactions  ? Benzodiazepines Shortness Of Breath, Anxiety  ? Severe anxiety, crying, shortness of breath. Pt states they have opposite effect of what they are supposed to help with  ? Diazepam Shortness Of Breath, Anxiety, Other (See Comments)  ? Severe anxiety with all benzos ?Severe anxiety  ? Alprazolam Anxiety, Other (See Comments)  ? Has opposite effect with xanax ?Hysterical crying, unable to walk and talk  ? ?  ? ?  ?Medication List  ?  ? ?STOP taking these medications   ? ?aspirin EC 81 MG tablet ?  ?cyclobenzaprine 10 MG tablet ?Commonly known as: FLEXERIL ?  ?levothyroxine 112 MCG tablet ?Commonly known as: SYNTHROID ?  ? ?   ? ?TAKE these medications   ? ?acetaminophen 325 MG tablet ?Commonly known as: TYLENOL ?Take 650 mg by mouth every 6 (six) hours as needed for moderate pain. ?  ?famotidine 20 MG tablet ?Commonly known as: PEPCID ?Take 20 mg by mouth 2 (two) times daily. ?  ?ibuprofen 200 MG tablet ?Commonly known as: ADVIL ?Take 3 tablets (600 mg total) by mouth every 8 (eight) hours as needed. ?  ?PRENATAL VITAMIN PO ?Take 1 capsule by mouth daily. ?  ?thyroid 60 MG tablet ?Commonly known as: ARMOUR ?Take 1 tablet (60 mg total) by mouth daily before breakfast. ?  ? ?  ? ? ? ?Discharge home in stable condition ?Infant Feeding: Breast ?Infant Disposition:home with mother ?Discharge instruction: per After Visit Summary and Postpartum booklet. ?Activity: Advance as tolerated. Pelvic rest for 6 weeks.  ?Diet: routine diet ?Anticipated Birth Control: Unsure ?Postpartum Appointment:4 weeks ?Additional Postpartum F/U:  none ?Future Appointments:No future appointments. ?Follow up Visit: ? Follow-up Information   ? ? Bobbye Charleston, MD. Schedule an appointment as soon as possible for a visit in 4 week(s).   ?Specialty: Obstetrics and Gynecology ?Contact information: ?[address] ?[address] ?[place] [address] ?[phone] ? ? ?  ?  ? ?  ?  ? ?  ? ? ? ?  ? ?08/28/2021 ?Rowland Lathe, MD ? ? ?

## 2021-08-28 NOTE — Progress Notes (Addendum)
Post Partum Day 2 ?Subjective: ?Doing well this morning. Reports some mild RUQ pain that comes and goes, had this during pregnancy as well. Has h/o cholecystectomy. Otherwise no complaints. Ambulating, voiding, tolerating PO. Minimal lochia. Breastfeeding.  ? ?Objective: ?Patient Vitals for the past 24 hrs: ? BP Temp Temp src Pulse Resp SpO2  ?08/28/21 0535 (!) 102/48 98.5 ?F (36.9 ?C) Oral 69 16 --  ?08/27/21 1954 116/75 98.5 ?F (36.9 ?C) Oral 95 17 100 %  ?08/27/21 1305 114/60 98.1 ?F (36.7 ?C) Oral 91 18 99 %  ? ? ?Physical Exam:  ?General: alert, cooperative, and no distress ?Lochia: appropriate ?Abd: minimal tenderness to deep palpation of RUQ, neg Murphy sign, some tenderness to right lower ribs ?Uterine Fundus: firm ?DVT Evaluation: No evidence of DVT seen on physical exam. ? ?Recent Labs  ?  08/26/21 ?7741 08/27/21 ?0453  ?WBC 12.4* 15.1*  ?HGB 11.5* 10.7*  ?HCT 34.6* 32.3*  ?PLT 238 212  ? ? ?Recent Labs  ?  08/28/21 ?1026  ?NA 137  ?K 4.1  ?CL 106  ?BUN 8  ?CREATININE 0.80  ?GLUCOSE 96  ?BILITOT 0.3  ?ALT 14  ?AST 18  ?ALKPHOS 74  ?PROT 5.6*  ?ALBUMIN 2.5*  ? ? ?Recent Labs  ?  08/28/21 ?1026  ?CALCIUM 9.0  ? ? ?No results for input(s): PROTIME, APTT, INR in the last 72 hours. ? ?No results for input(s): PROTIME, APTT, INR, FIBRINOGEN in the last 72 hours. ?Assessment/Plan: ? ?Renee Jensen 26 y.o. [id number] PPD#2 sp SVD ?1. PPC: routine PP care ?2. RUQ pain: likely gas vs. MSK, checked CMP this AM which was normal. No HTN or other preeclampsia signs. Pt reassured ?3. Rh pos ?4. Dispo: stable for discharge home. Discharge instructions reviewed. Call for worsening pain or development of new symptoms (nausea/vomiting), heavy VB, fever.  ? ? ? LOS: 2 days  ? ?Rowland Lathe ?08/28/2021, 11:38 AM  ? ?

## 2021-09-05 ENCOUNTER — Telehealth (HOSPITAL_COMMUNITY): Payer: Self-pay

## 2021-09-05 NOTE — Telephone Encounter (Signed)
"  I'm good. I have a hemerhoid. My OB gave me a prescription for some foam, so I have been using that. My bleeding is pretty much gone." Patient declines questions or concerns about her healing.  ? ?"She's really good. She sleeps a lot. We are breastfeeding and doing some formula as well. She does really good with both. She has lots of poop diapers is that normal?" RN told patient that it is common for baby's to need a diaper change after feedings, but to speak with her pediatrician if she is concerned. RN also told patient that breastmilk acts as a natural laxative for baby's. "She sleeps in a bassinet." RN reviewed ABC's of safe sleep with patient. Patient declines any questions or concerns about baby. ? ?EPDS score is 5. ? ?Sharyn Lull Tayte Childers,RN3,MSN,RNC-MNN ?09/05/2021,1940 ?

## 2022-11-23 IMAGING — US US OB < 14 WEEKS - US OB TV
1 series · 15 of 28 positions shown · non-contrast
Comparison: None.

CLINICAL DATA: Pregnant patient in first-trimester pregnancy with
vaginal spotting today. Gestational age 8 weeks 0 days by outpatient
ultrasound.

EXAM:
OBSTETRIC <14 WK US AND TRANSVAGINAL OB US
TECHNIQUE: Both transabdominal and transvaginal ultrasound examinations were
performed for complete evaluation of the gestation as well as the
maternal uterus, adnexal regions, and pelvic cul-de-sac.
Transvaginal technique was performed to assess early pregnancy.

[Series 1: us ob < 14 weeks - us ob tv · 15 of 67 slices shown]
[im 1/67]
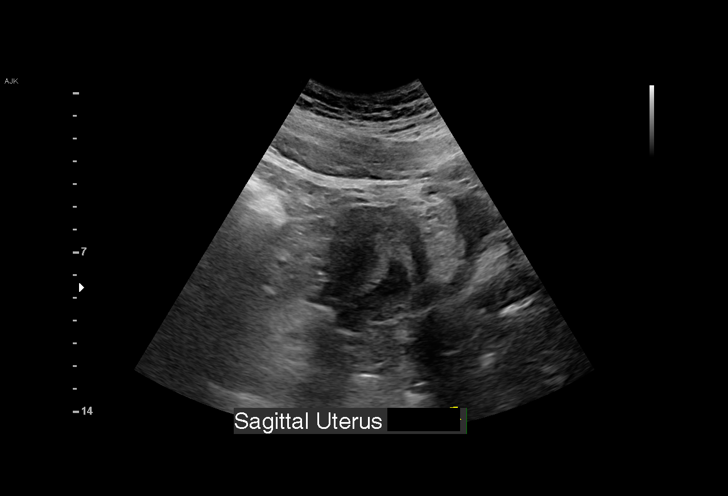
[im 5/67]
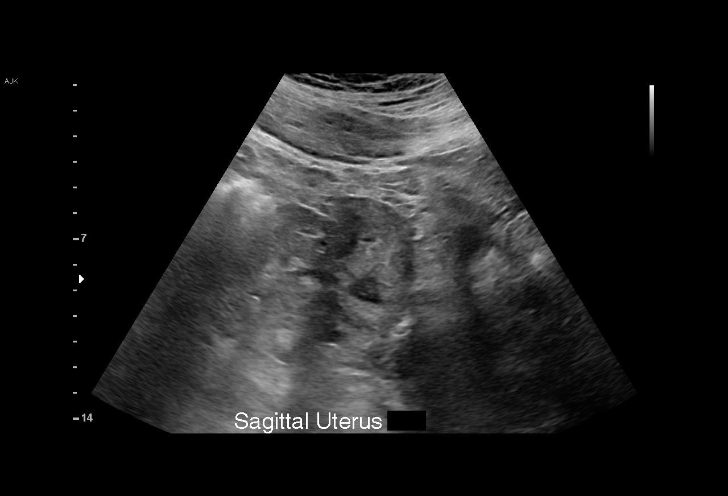
[im 10/67]
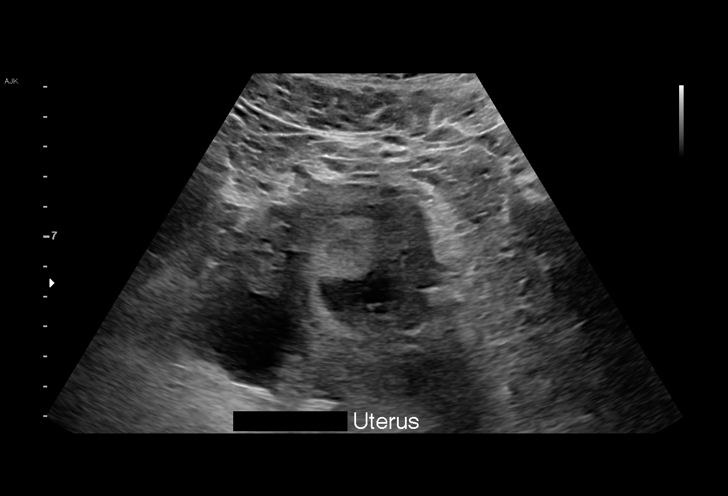
[im 15/67]
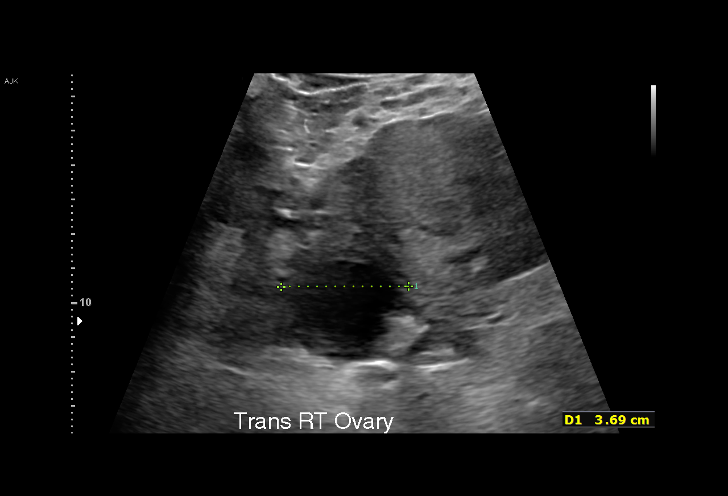
[im 20/67]
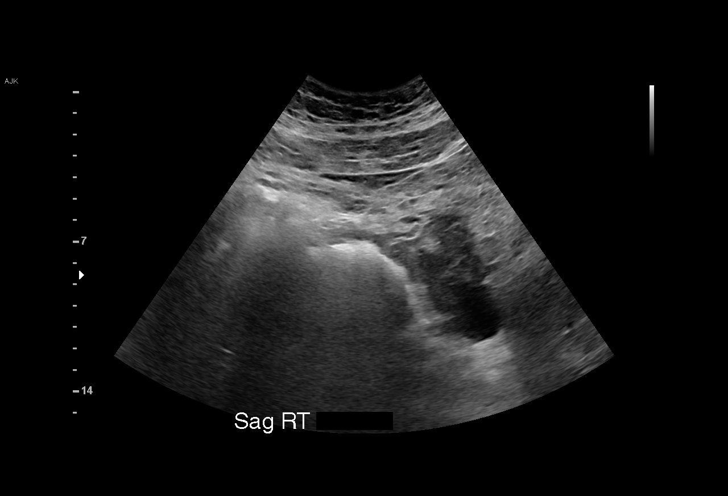
[im 25/67]
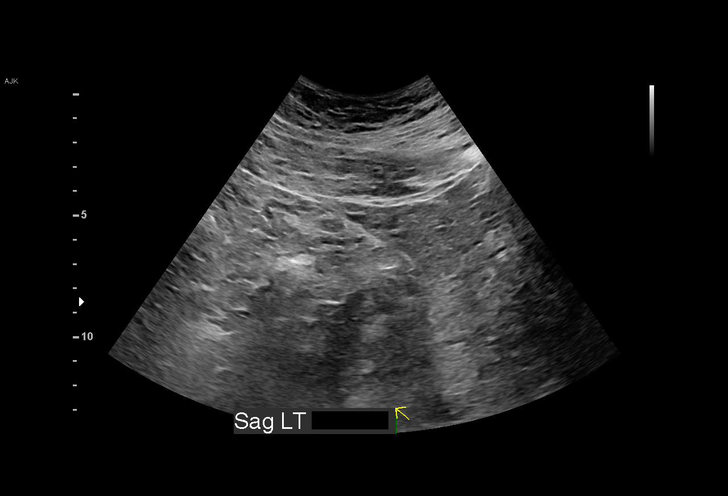
[im 30/67]
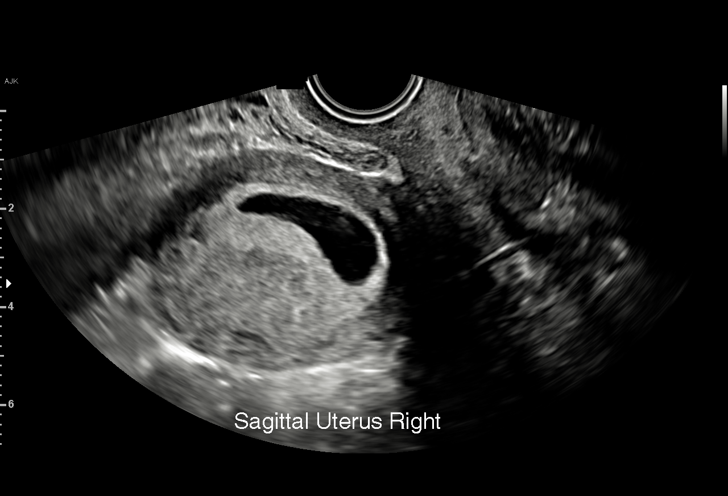
[im 35/67]
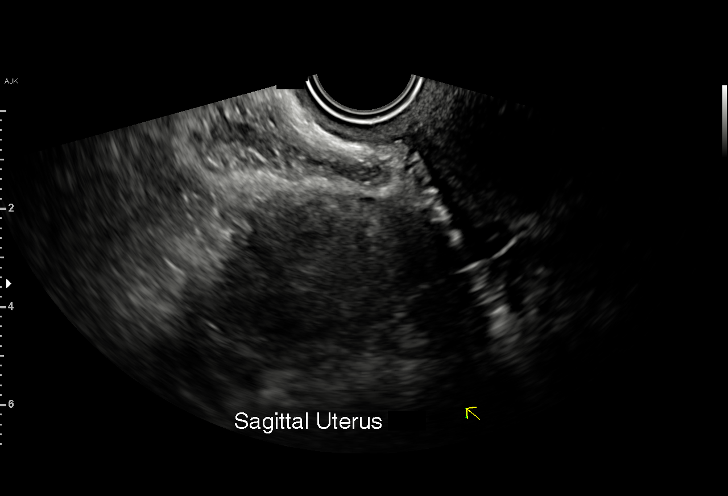
[im 37/67]
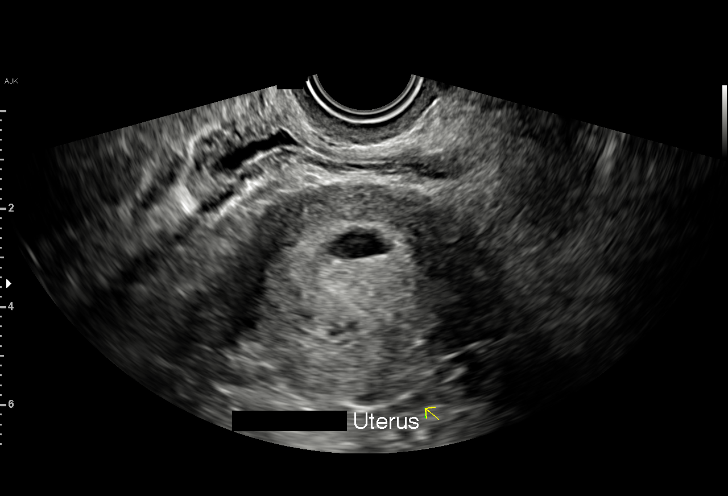
[im 42/67]
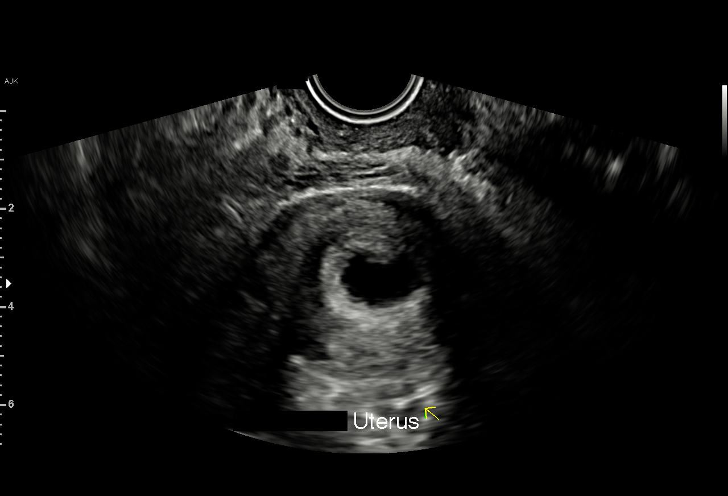
[im 47/67]
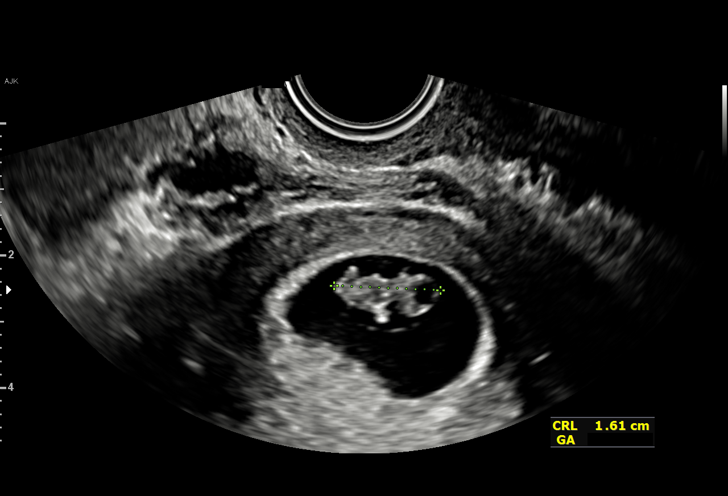
[im 52/67]
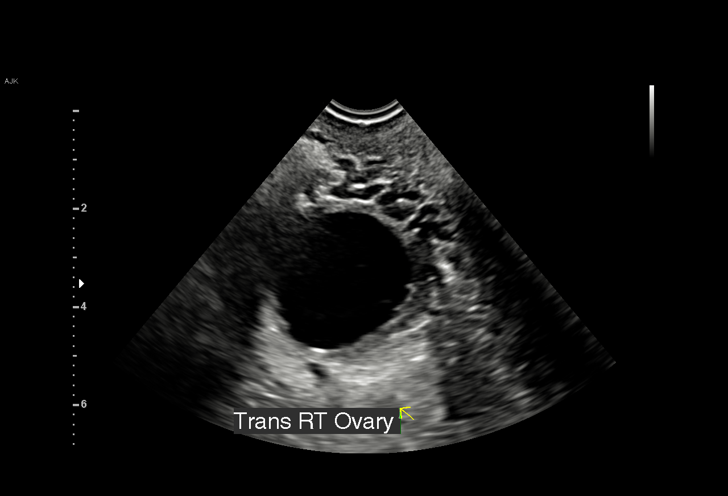
[im 57/67]
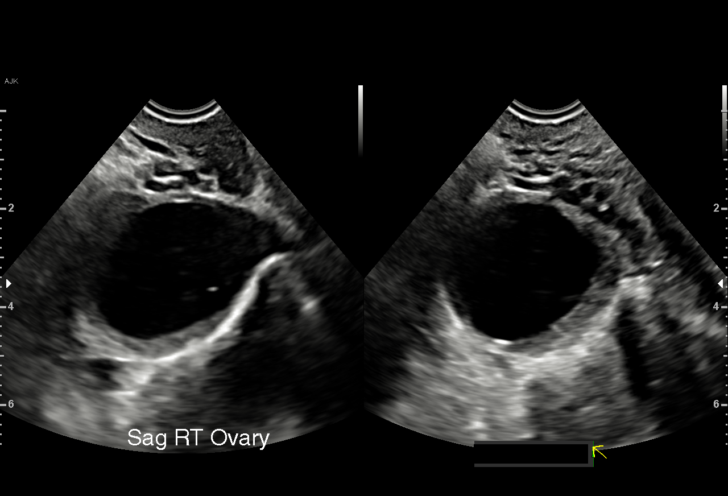
[im 62/67]
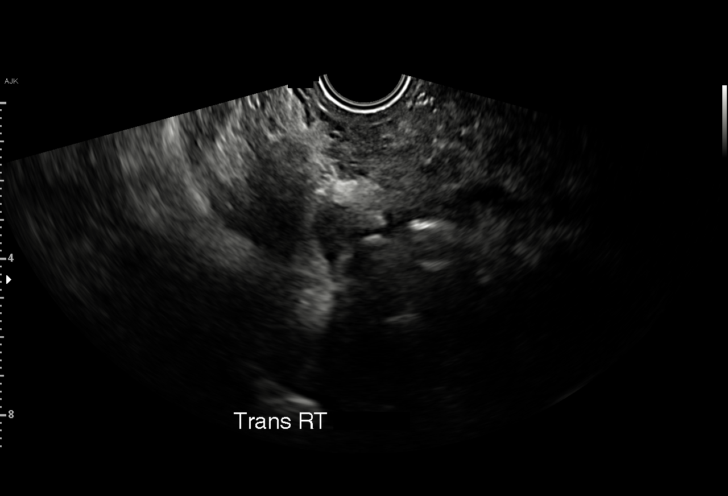
[im 67/67]
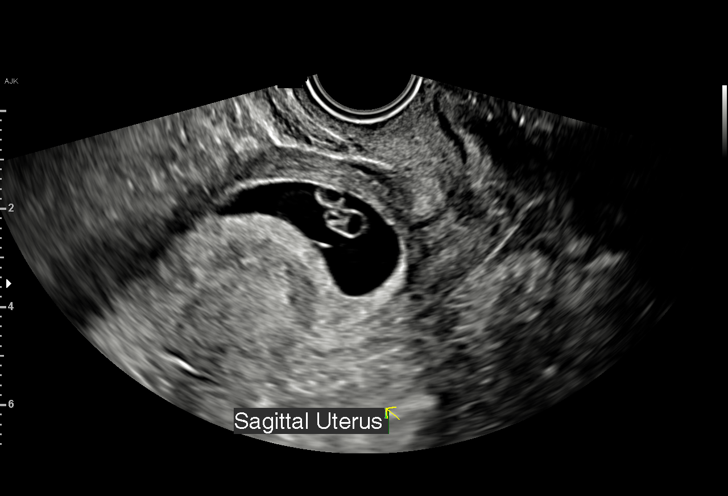

[15 of 28 positions shown; findings below may reference images not displayed]

FINDINGS: Intrauterine gestational sac: Single

Yolk sac:  Visualized.

Embryo:  Visualized.

Cardiac Activity: Visualized.

Heart Rate: 174 bpm

CRL:  16.2 mm   8 w   0 d                  US EDC: 08/31/2021

Subchorionic hemorrhage:  None visualized.

Maternal uterus/adnexae: The left ovary is surgically absent. There
is no left adnexal mass. There is a 3.6 cm simple cyst within the
right ovary. Right ovarian blood flow is seen. No right adnexal
mass. No pelvic free fluid.
IMPRESSION: 1. Single live intrauterine pregnancy estimated gestational age 8
weeks 0 days based on crown-rump length for ultrasound EDC
08/31/2021.
2. No subchorionic hemorrhage.
3. Simple cyst in the right ovary measures 3.6 cm. Ovarian blood
flow is seen.

## 2023-05-11 IMAGING — DX DG CHEST 1V PORT
1 series · 1 of 1 positions shown · non-contrast
Comparison: 02/13/2020

CLINICAL DATA: Shortness of breath.

EXAM:
PORTABLE CHEST 1 VIEW

[chest]
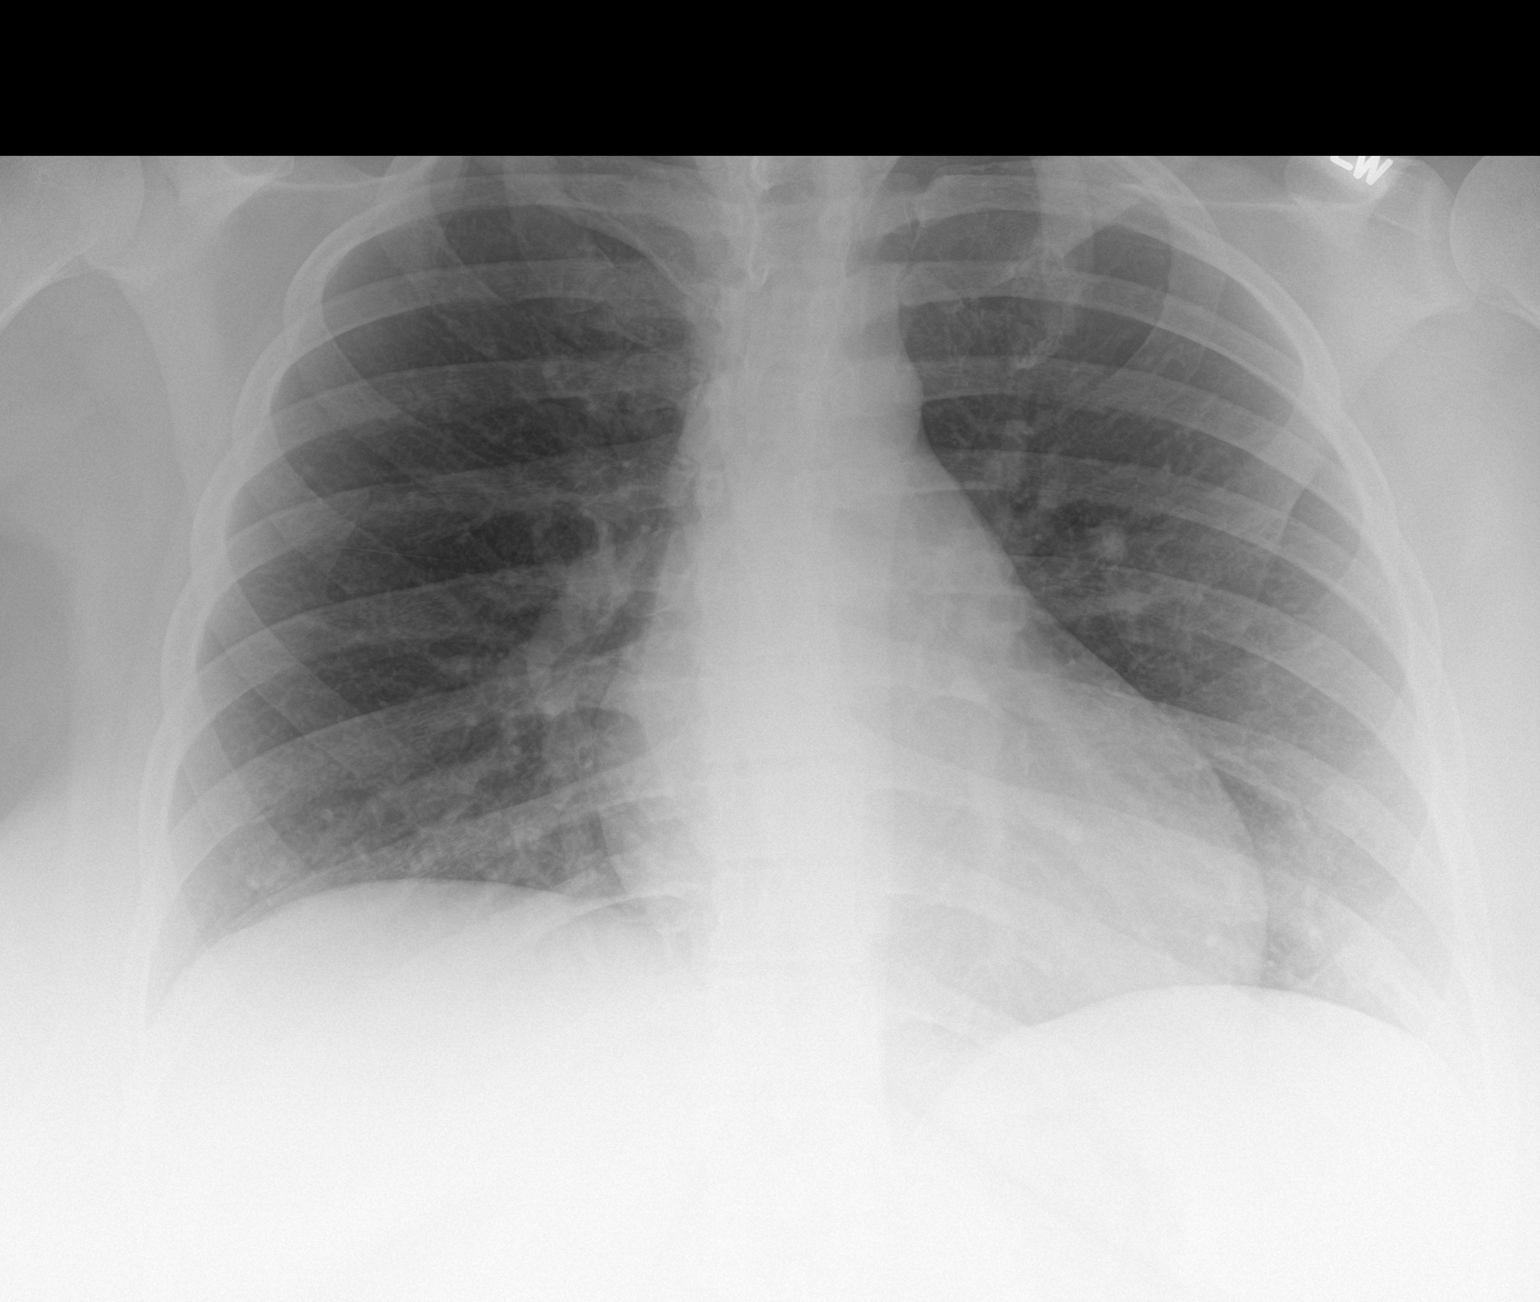

[1 of 1 positions shown; findings below may reference images not displayed]

FINDINGS: Single-view of the chest demonstrates clear lungs. Heart and
mediastinum are within normal limits. Trachea is midline. Negative
for a pneumothorax. No acute bone abnormality.
IMPRESSION: No active disease.
